# Patient Record
Sex: Female | Born: 1990
Health system: Southern US, Community
[De-identification: ages and names within clinical notes are randomized; demographics above are authoritative.]

## PROBLEM LIST (undated history)

## (undated) ENCOUNTER — Inpatient Hospital Stay: Payer: Self-pay

## (undated) DIAGNOSIS — R112 Nausea with vomiting, unspecified: Secondary | ICD-10-CM

## (undated) DIAGNOSIS — Z87442 Personal history of urinary calculi: Secondary | ICD-10-CM

## (undated) DIAGNOSIS — T8859XA Other complications of anesthesia, initial encounter: Secondary | ICD-10-CM

## (undated) DIAGNOSIS — T4145XA Adverse effect of unspecified anesthetic, initial encounter: Secondary | ICD-10-CM

## (undated) DIAGNOSIS — M1990 Unspecified osteoarthritis, unspecified site: Secondary | ICD-10-CM

## (undated) DIAGNOSIS — I10 Essential (primary) hypertension: Secondary | ICD-10-CM

## (undated) DIAGNOSIS — K429 Umbilical hernia without obstruction or gangrene: Secondary | ICD-10-CM

## (undated) DIAGNOSIS — Z9889 Other specified postprocedural states: Secondary | ICD-10-CM

## (undated) DIAGNOSIS — D649 Anemia, unspecified: Secondary | ICD-10-CM

## (undated) DIAGNOSIS — G610 Guillain-Barre syndrome: Secondary | ICD-10-CM

## (undated) HISTORY — PX: WISDOM TOOTH EXTRACTION: SHX21

## (undated) HISTORY — PX: HERNIA REPAIR: SHX51

## (undated) HISTORY — PX: NO PAST SURGERIES: SHX2092

---
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
## 2011-08-16 ENCOUNTER — Ambulatory Visit: Payer: Self-pay | Admitting: Physician Assistant

## 2011-08-25 DIAGNOSIS — G610 Guillain-Barre syndrome: Secondary | ICD-10-CM

## 2011-08-25 DIAGNOSIS — Z8669 Personal history of other diseases of the nervous system and sense organs: Secondary | ICD-10-CM

## 2011-08-25 HISTORY — DX: Guillain-Barre syndrome: G61.0

## 2011-08-25 HISTORY — DX: Personal history of other diseases of the nervous system and sense organs: Z86.69

## 2015-05-22 ENCOUNTER — Other Ambulatory Visit: Payer: Self-pay | Admitting: Obstetrics and Gynecology

## 2015-05-22 DIAGNOSIS — Z369 Encounter for antenatal screening, unspecified: Secondary | ICD-10-CM

## 2015-05-23 LAB — OB RESULTS CONSOLE ABO/RH: RH Type: POSITIVE

## 2015-05-23 LAB — OB RESULTS CONSOLE ANTIBODY SCREEN: ANTIBODY SCREEN: NEGATIVE

## 2015-05-23 LAB — OB RESULTS CONSOLE HEPATITIS B SURFACE ANTIGEN: Hepatitis B Surface Ag: NEGATIVE

## 2015-05-23 LAB — OB RESULTS CONSOLE GC/CHLAMYDIA
CHLAMYDIA, DNA PROBE: NEGATIVE
GC PROBE AMP, GENITAL: NEGATIVE

## 2015-05-23 LAB — OB RESULTS CONSOLE VARICELLA ZOSTER ANTIBODY, IGG: VARICELLA IGG: IMMUNE

## 2015-05-23 LAB — OB RESULTS CONSOLE HIV ANTIBODY (ROUTINE TESTING): HIV: NONREACTIVE

## 2015-05-23 LAB — OB RESULTS CONSOLE RPR: RPR: NONREACTIVE

## 2015-05-23 LAB — OB RESULTS CONSOLE RUBELLA ANTIBODY, IGM: Rubella: IMMUNE

## 2015-06-03 ENCOUNTER — Ambulatory Visit (HOSPITAL_BASED_OUTPATIENT_CLINIC_OR_DEPARTMENT_OTHER)
Admission: RE | Admit: 2015-06-03 | Discharge: 2015-06-03 | Disposition: A | Discharge status: Home / Self Care | Payer: Medicaid Other | Source: Ambulatory Visit | Attending: Maternal & Fetal Medicine | Admitting: Maternal & Fetal Medicine

## 2015-06-03 ENCOUNTER — Ambulatory Visit
Admission: RE | Admit: 2015-06-03 | Discharge: 2015-06-03 | Disposition: A | Discharge status: Home / Self Care | Payer: Medicaid Other | Source: Ambulatory Visit | Attending: Obstetrics and Gynecology | Admitting: Obstetrics and Gynecology

## 2015-06-03 DIAGNOSIS — Z36 Encounter for antenatal screening of mother: Secondary | ICD-10-CM | POA: Insufficient documentation

## 2015-06-03 DIAGNOSIS — Z369 Encounter for antenatal screening, unspecified: Secondary | ICD-10-CM

## 2015-06-03 DIAGNOSIS — Z8279 Family history of other congenital malformations, deformations and chromosomal abnormalities: Secondary | ICD-10-CM

## 2015-06-03 HISTORY — DX: Guillain-Barre syndrome: G61.0

## 2015-06-03 NOTE — Progress Notes (Addendum)
Referring physician:  [facility] Length of Consultation: 40 minute consultation   Holly Estrada  was referred to [facility] for genetic counseling to review prenatal screening and testing options.  This note summarizes the information we discussed. The patient was counseled by Holly Estrada, genetic counseling intern, directly supervised by Holly Stack, MS, CGC.   The following screening tests were offered: First trimester screening, which includes nuchal translucency ultrasound screen and first trimester maternal serum marker screening.  The nuchal translucency has approximately an 80% detection rate for Down syndrome and can be positive for other chromosome abnormalities as well as congenital heart defects.  When combined with a maternal serum marker screening, the detection rate is up to 90% for Down syndrome and up to 97% for trisomy 18.     Maternal serum marker screening, a blood test that measures pregnancy proteins, can provide risk assessments for Down syndrome, trisomy 18, and open neural tube defects (spina bifida, anencephaly). Because it does not directly examine the fetus, it cannot positively diagnose or rule out these problems.  Targeted ultrasound uses high frequency sound waves to create an image of the developing fetus.  An ultrasound is often recommended as a routine means of evaluating the pregnancy.  It is also used to screen for fetal anatomy problems (for example, a heart defect) that might be suggestive of a chromosomal or other abnormality.   Should these screening tests indicate an increased concern, then the following additional testing options would be offered:  The chorionic villus sampling procedure is available for first trimester chromosome analysis.  This involves the withdrawal of a small amount of chorionic villi (tissue from the developing placenta).  Risk of pregnancy loss is estimated to be approximately 1 in 200 to 1 in 100 (0.5  to 1%).  There is approximately a 1% (1 in 100) chance that the CVS chromosome results will be unclear.  Chorionic villi cannot be tested for neural tube defects.     Amniocentesis involves the removal of a small amount of amniotic fluid from the sac surrounding the fetus with the use of a thin needle inserted through the maternal abdomen and uterus.  Ultrasound guidance is used throughout the procedure.  Fetal cells from amniotic fluid are directly evaluated and > 99.5% of chromosome problems and > 98% of open neural tube defects can be detected. This procedure is generally performed after the 15th week of pregnancy.  The main risks to this procedure include complications leading to miscarriage in less than 1 in 200 cases (0.5%).  As another option for information if the pregnancy is suspected to be an an increased chance for certain chromosome conditions, we also reviewed the availability of cell free fetal DNA testing from maternal blood to determine whether or not the baby may have either Down syndrome, trisomy 27, or trisomy 18.  This test utilizes a maternal blood sample and DNA sequencing technology to isolate circulating cell free fetal DNA from maternal plasma.  The fetal DNA can then be analyzed for DNA sequences that are derived from the three most common chromosomes involved in aneuploidy, chromosomes 13, 18, and 21.  If the overall amount of DNA is greater than the expected level for any of these chromosomes, aneuploidy is suspected.  While we do not consider it a replacement for invasive testing and karyotype analysis, a negative result from this testing would be reassuring, though not a guarantee of a normal chromosome complement for the baby.  An abnormal result is certainly suggestive of an abnormal chromosome complement, though we would still recommend CVS or amniocentesis to confirm any findings from this testing.  We obtained a detailed family history and pregnancy history. This is Ms.  Estrada's first pregnancy. Holly Estrada, the father of the baby, has a seven-year-old daughter who was born with a heart defect. Holly Estrada mother has a history of two miscarriages, one being a twin pregnancy, as a result of a weak cervix, which was later corrected through surgery. The remainder of the family history is unremarkable for birth defects, developmental delays, chromosomal abnormalities, multiple miscarriages and consanguinity.   We discussed with Holly Estrada, the history of congenital heart defect on Holly Estrada's side. Heart defects can be caused by a number of things, both genetic and otherwise. When we see heart defects with no other associated intellectual or physical differences, we consider these multifactorial. This means a combination of genetic and environmental factors play a part in development of the defect. In these cases, we know family members are at a higher risk for congenital heart defects. Half siblings, or babies who share one parent with a child born with a congenital heart defect, have a 1-2% risk of having a similar defect. On the other hand, if this heart defect were found to be part of a syndrome, recurrence risk in a sibling could be as high as 50%. Syndromes can usually be identified if the heart defect is accompanied by other intellectual or physical differences. We can not completely rule this out without obtaining records on Holly Estrada's daughter, which we are happy to review. Given this history, we recommend a detailed anatomy ultrasound at 18 weeks and a fetal echocardiogram at [redacted] weeks gestation to further assess for structural heart defects.   We also discussed with Holly Estrada the option of sickle cell carrier testing. In the African American population, approximately 1 in 10 people are carriers of the sickle cell trait. When a person is a carrier of sickle cell trait, they have a change in one of their hemoglobin genes. This gene helps to form red blood cells that carry oxygen  throughout the body. Carriers are not expected to have any symptoms, but have a 50% chance of passing on the hemoglobin gene with the change to their children. If two people who are carriers of the sickle cell trait have children together, each pregnancy will have a 25% chance of being affected by sickle cell disease, a 50% chance of carrying the sickle cell trait, and a 25% chance of not carrying a change in either hemoglobin gene.  Holly Estrada reported that he is not a carrier of sickle cell trait. If this is the case, we know that he cannot pass on a change, and the risk of sickle cell disease in this pregnancy is very low. Without viewing his records, we cannot be sure of his carrier status. If Holly Estrada is interested in being screened in the future, she is encouraged to speak with [place] or her OB. She was also informed that all babies are tested at birth on the newborn screen both for sickle cell trait and sickle cell disease.   Holly Estrada reported no complications or exposures in the pregnancy that would be expected to increase the risk for birth defects.  After consideration of the options, Holly Estrada elected to proceed with first trimester screening and to schedule a fetal echocardiogram.  An ultrasound was performed at the time of the visit.  The gestational age  was consistent with  12 weeks.  Fetal anatomy could not be assessed due to early gestational age.  Please refer to the ultrasound report for details of that study.  Holly Estrada was encouraged to call with questions or concerns.  We can be contacted at [phone].  Holly Anderson, MS, CGC   I was immediately available and supervising. Holly Ponder, MD [facility]

## 2015-06-06 ENCOUNTER — Telehealth: Payer: Self-pay | Admitting: Obstetrics and Gynecology

## 2015-06-06 NOTE — Telephone Encounter (Signed)
Ms. Holly Estrada  elected to undergo First Trimester screening on June 03, 2015.  To review, first trimester screening, includes nuchal translucency ultrasound screen and/or first trimester maternal serum marker screening.  The nuchal translucency has approximately an 80% detection rate for Down syndrome and can be positive for other chromosome abnormalities as well as heart defects.  When combined with a maternal serum marker screening, the detection rate is up to 90% for Down syndrome and up to 97% for trisomy 13 and 18.     The results of the First Trimester Nuchal Translucency and Biochemical Screening were within normal range.  The risk for Down syndrome is now estimated to be 1 in 1,795.  The risk for Trisomy 13/18 is less than 1 in 10,000  Should more definitive information be desired, we would offer amniocentesis.  Because we do not yet know the effectiveness of combined first and second trimester screening, we do not recommend a maternal serum screen to assess the chance for chromosome conditions.  However, if screening for neural tube defects is desired, maternal serum screening for AFP only can be performed between 15 and [redacted] weeks gestation.

## 2015-08-05 ENCOUNTER — Ambulatory Visit
Admission: RE | Admit: 2015-08-05 | Discharge: 2015-08-05 | Disposition: A | Discharge status: Home / Self Care | Payer: Medicaid Other | Source: Ambulatory Visit | Attending: Obstetrics and Gynecology | Admitting: Obstetrics and Gynecology

## 2015-08-05 ENCOUNTER — Other Ambulatory Visit: Payer: Self-pay | Admitting: Obstetrics and Gynecology

## 2015-08-05 DIAGNOSIS — O26893 Other specified pregnancy related conditions, third trimester: Secondary | ICD-10-CM | POA: Insufficient documentation

## 2015-08-05 DIAGNOSIS — R109 Unspecified abdominal pain: Secondary | ICD-10-CM | POA: Diagnosis present

## 2015-08-05 DIAGNOSIS — N1330 Unspecified hydronephrosis: Secondary | ICD-10-CM | POA: Diagnosis not present

## 2015-08-25 DIAGNOSIS — Z8759 Personal history of other complications of pregnancy, childbirth and the puerperium: Secondary | ICD-10-CM

## 2015-08-25 HISTORY — DX: Personal history of other complications of pregnancy, childbirth and the puerperium: Z87.59

## 2015-11-10 ENCOUNTER — Observation Stay
Admission: EM | Admit: 2015-11-10 | Discharge: 2015-11-10 | Disposition: A | Discharge status: Home / Self Care | Payer: Medicaid Other | Attending: Obstetrics and Gynecology | Admitting: Obstetrics and Gynecology

## 2015-11-10 DIAGNOSIS — O4290 Premature rupture of membranes, unspecified as to length of time between rupture and onset of labor, unspecified weeks of gestation: Principal | ICD-10-CM | POA: Insufficient documentation

## 2015-11-10 HISTORY — DX: Anemia, unspecified: D64.9

## 2015-12-13 ENCOUNTER — Inpatient Hospital Stay
Admission: EM | Admit: 2015-12-13 | Discharge: 2015-12-18 | DRG: 765 | Disposition: A | Discharge status: Home / Self Care | Payer: Medicaid Other | Attending: Obstetrics and Gynecology | Admitting: Obstetrics and Gynecology

## 2015-12-13 DIAGNOSIS — G610 Guillain-Barre syndrome: Secondary | ICD-10-CM | POA: Diagnosis present

## 2015-12-13 DIAGNOSIS — Z3A40 40 weeks gestation of pregnancy: Secondary | ICD-10-CM | POA: Diagnosis not present

## 2015-12-13 DIAGNOSIS — O9852 Other viral diseases complicating childbirth: Secondary | ICD-10-CM | POA: Diagnosis present

## 2015-12-13 DIAGNOSIS — Z79899 Other long term (current) drug therapy: Secondary | ICD-10-CM | POA: Diagnosis not present

## 2015-12-13 DIAGNOSIS — D62 Acute posthemorrhagic anemia: Secondary | ICD-10-CM | POA: Diagnosis not present

## 2015-12-13 DIAGNOSIS — Z841 Family history of disorders of kidney and ureter: Secondary | ICD-10-CM

## 2015-12-13 DIAGNOSIS — Z23 Encounter for immunization: Secondary | ICD-10-CM | POA: Diagnosis not present

## 2015-12-13 DIAGNOSIS — Z8279 Family history of other congenital malformations, deformations and chromosomal abnormalities: Secondary | ICD-10-CM

## 2015-12-13 DIAGNOSIS — O9902 Anemia complicating childbirth: Secondary | ICD-10-CM | POA: Diagnosis present

## 2015-12-13 DIAGNOSIS — Z9889 Other specified postprocedural states: Secondary | ICD-10-CM

## 2015-12-13 LAB — OB RESULTS CONSOLE GBS: GBS: NEGATIVE

## 2015-12-13 LAB — CBC
HEMATOCRIT: 34.2 % — AB (ref 35.0–47.0)
HEMOGLOBIN: 11.7 g/dL — AB (ref 12.0–16.0)
MCH: 32 pg (ref 26.0–34.0)
MCHC: 34.3 g/dL (ref 32.0–36.0)
MCV: 93.3 fL (ref 80.0–100.0)
Platelets: 160 10*3/uL (ref 150–440)
RBC: 3.67 MIL/uL — ABNORMAL LOW (ref 3.80–5.20)
RDW: 12.8 % (ref 11.5–14.5)
WBC: 11.7 10*3/uL — ABNORMAL HIGH (ref 3.6–11.0)

## 2015-12-13 LAB — TYPE AND SCREEN
ABO/RH(D): A POS
ANTIBODY SCREEN: NEGATIVE

## 2015-12-13 MED ORDER — LIDOCAINE HCL (PF) 1 % IJ SOLN
30.0000 mL | INTRAMUSCULAR | Status: DC | PRN
  Filled 2015-12-13: qty 30

## 2015-12-13 MED ORDER — OXYCODONE-ACETAMINOPHEN 5-325 MG PO TABS: 1.0000 | ORAL_TABLET | ORAL | Status: DC | PRN

## 2015-12-13 MED ORDER — PRENATAL MULTIVITAMIN CH
1.0000 | ORAL_TABLET | Freq: Every day | ORAL | Status: DC
Start: 2015-12-13 — End: 2015-12-13

## 2015-12-13 MED ORDER — OXYTOCIN BOLUS FROM INFUSION: 500.0000 mL | INTRAVENOUS | Status: DC

## 2015-12-13 MED ORDER — LEVOTHYROXINE SODIUM 150 MCG PO TABS: 150.0000 ug | ORAL_TABLET | Freq: Every day | ORAL | Status: DC

## 2015-12-13 MED ORDER — ONDANSETRON HCL 4 MG/2ML IJ SOLN: 4.0000 mg | Freq: Four times a day (QID) | INTRAMUSCULAR | Status: DC | PRN

## 2015-12-13 MED ORDER — BUTORPHANOL TARTRATE 1 MG/ML IJ SOLN: 1.0000 mg | INTRAMUSCULAR | Status: DC | PRN

## 2015-12-13 MED ORDER — LACTATED RINGERS IV SOLN: 500.0000 mL | INTRAVENOUS | Status: DC | PRN

## 2015-12-13 MED ORDER — TERBUTALINE SULFATE 1 MG/ML IJ SOLN: 0.2500 mg | Freq: Once | INTRAMUSCULAR | Status: DC | PRN

## 2015-12-13 MED ORDER — OXYTOCIN 40 UNITS IN LACTATED RINGERS INFUSION - SIMPLE MED
2.5000 [IU]/h | INTRAVENOUS | Status: DC
  Filled 2015-12-13: qty 1000

## 2015-12-13 MED ORDER — LACTATED RINGERS IV SOLN
INTRAVENOUS | Status: DC
  Administered 2015-12-13: 21:00:00 via INTRAVENOUS
  Administered 2015-12-14 (×2): 125 mL/h via INTRAVENOUS
  Administered 2015-12-14 – 2015-12-15 (×2): via INTRAVENOUS

## 2015-12-13 MED ORDER — OXYTOCIN 10 UNIT/ML IJ SOLN
INTRAMUSCULAR | Status: AC
  Filled 2015-12-13: qty 2

## 2015-12-13 MED ORDER — DINOPROSTONE 10 MG VA INST
10.0000 mg | VAGINAL_INSERT | Freq: Once | VAGINAL | Status: AC
  Administered 2015-12-13: 10 mg via VAGINAL
  Filled 2015-12-13: qty 1

## 2015-12-13 MED ORDER — OXYCODONE-ACETAMINOPHEN 5-325 MG PO TABS: 2.0000 | ORAL_TABLET | ORAL | Status: DC | PRN

## 2015-12-13 MED ORDER — ACETAMINOPHEN 325 MG PO TABS: 650.0000 mg | ORAL_TABLET | ORAL | Status: DC | PRN

## 2015-12-13 MED ORDER — AMMONIA AROMATIC IN INHA
RESPIRATORY_TRACT | Status: AC
  Filled 2015-12-13: qty 10

## 2015-12-13 MED ORDER — CITRIC ACID-SODIUM CITRATE 334-500 MG/5ML PO SOLN
30.0000 mL | ORAL | Status: DC | PRN
  Administered 2015-12-15: 30 mL via ORAL
  Filled 2015-12-13: qty 30

## 2015-12-13 MED ORDER — SODIUM CHLORIDE FLUSH 0.9 % IV SOLN
INTRAVENOUS | Status: AC
  Filled 2015-12-13: qty 10

## 2015-12-13 MED ORDER — ZOLPIDEM TARTRATE 5 MG PO TABS
5.0000 mg | ORAL_TABLET | Freq: Every evening | ORAL | Status: DC | PRN
  Administered 2015-12-13: 5 mg via ORAL
  Filled 2015-12-13: qty 1

## 2015-12-13 MED ORDER — MISOPROSTOL 200 MCG PO TABS
ORAL_TABLET | ORAL | Status: AC
  Filled 2015-12-13: qty 4

## 2015-12-13 NOTE — H&P (Signed)
  OB ADMISSION/ HISTORY & PHYSICAL:  Admission Date: 12/13/2015  8:26 PM  Admit Diagnosis: elective postdates induction of labor at 40+2 weeks  Holly Estrada is a 25 y.o. female presenting for elective postdates induction of labor at 40+2 weeks   Prenatal History: G1P0000   EDC : 12/11/2015, by Ultrasound  [facility]  Prenatal course complicated by Hx. Of Guillain-Barre Syndrome - flu vaccine contraindicated   Prenatal Labs: ABO, Rh: --/--/PENDING (04/21 2113) Antibody: PENDING (04/21 2113) Rubella: Immune (09/29 0000)  RPR: Nonreactive (09/29 0000)  HBsAg: Negative (09/29 0000)  HIV: Non-reactive (09/29 0000)  GTT: 104 GBS:   Negative  Varicella: Immune  Medical / Surgical History :  Past medical history:  Past Medical History  Diagnosis Date  . Guillain Barr syndrome (HCC) 2013  . Anemia      Past surgical history:  Past Surgical History  Procedure Laterality Date  . No past surgeries      Family History:  Family History  Problem Relation Age of Onset  . Kidney failure Maternal Grandfather      Social History:  reports that she has never smoked. She has never used smokeless tobacco. She reports that she does not drink alcohol or use illicit drugs.   Allergies: Review of patient's allergies indicates no known allergies.    Current Medications at time of admission:  Prior to Admission medications   Medication Sig Start Date End Date Taking? Authorizing Provider  Ascorbic Acid (VITAMIN C) 100 MG tablet Take 100 mg by mouth daily.    Historical Provider, MD  ferrous sulfate 325 (65 FE) MG tablet Take 325 mg by mouth daily with breakfast.    Historical Provider, MD  Prenatal Vit-Fe Fumarate-FA (PRENATAL MULTIVITAMIN) TABS tablet Take 1 tablet by mouth daily at 12 noon.    Historical Provider, MD     Review of Systems: Active FM Ctx currently every minutes No LOF  / SROM  NO VB, bloody show   Physical Exam:  VS: Blood pressure  135/81, pulse 61, height 5\' 5"  (1.651 m), weight 83.915 kg (185 lb).  General: alert and oriented, appears calm Heart: RRR Lungs: Clear lung fields Abdomen: Gravid, soft and non-tender, non-distended / uterus: gravid, non-tender Extremities: no edema  Genitalia / VE: Dilation: 1 Exam by:: Colletta MarylandN. Sullivan, RN  FHR: baseline rate 140 bpm/ variability Moderate / accelerations + / no decelerations TOCO: every 2-7 minutes  Assessment: 40+[redacted] weeks gestation IOL stage of labor for postdates FHR category 1   Plan:  1. Admit to [facility]   - Routine labor and delivery orders   - May have Stadol 1mg  IVP every 1 hour PRN for pain   - Cervidil 10mg  vaginally x 1 for induction  2. GBS Negative   - No prophylaxis indication  3. Anticipate NSVD  - EFW on 4/4: 6#12oz  Dr. Feliberto GottronSchermerhorn notified of admission / plan of care  Carlean JewsMeredith Lior Hoen, CNM

## 2015-12-14 ENCOUNTER — Inpatient Hospital Stay: Payer: Medicaid Other | Admitting: Anesthesiology

## 2015-12-14 LAB — COMPREHENSIVE METABOLIC PANEL
ALT: 15 U/L (ref 14–54)
ANION GAP: 9 (ref 5–15)
AST: 26 U/L (ref 15–41)
Albumin: 3 g/dL — ABNORMAL LOW (ref 3.5–5.0)
Alkaline Phosphatase: 141 U/L — ABNORMAL HIGH (ref 38–126)
BILIRUBIN TOTAL: 0.4 mg/dL (ref 0.3–1.2)
BUN: <5 mg/dL — ABNORMAL LOW (ref 6–20)
CO2: 19 mmol/L — ABNORMAL LOW (ref 22–32)
Calcium: 8.9 mg/dL (ref 8.9–10.3)
Chloride: 109 mmol/L (ref 101–111)
Creatinine, Ser: 0.57 mg/dL (ref 0.44–1.00)
Glucose, Bld: 79 mg/dL (ref 65–99)
POTASSIUM: 3.7 mmol/L (ref 3.5–5.1)
Sodium: 137 mmol/L (ref 135–145)
TOTAL PROTEIN: 6.2 g/dL — AB (ref 6.5–8.1)

## 2015-12-14 LAB — CBC
HCT: 35.3 % (ref 35.0–47.0)
HEMOGLOBIN: 12.3 g/dL (ref 12.0–16.0)
MCH: 32.7 pg (ref 26.0–34.0)
MCHC: 34.8 g/dL (ref 32.0–36.0)
MCV: 93.8 fL (ref 80.0–100.0)
Platelets: 140 10*3/uL — ABNORMAL LOW (ref 150–440)
RBC: 3.76 MIL/uL — AB (ref 3.80–5.20)
RDW: 12.7 % (ref 11.5–14.5)
WBC: 13.1 10*3/uL — AB (ref 3.6–11.0)

## 2015-12-14 LAB — PROTEIN / CREATININE RATIO, URINE
CREATININE, URINE: 37 mg/dL
PROTEIN CREATININE RATIO: 1.08 mg/mg{creat} — AB (ref 0.00–0.15)
TOTAL PROTEIN, URINE: 40 mg/dL

## 2015-12-14 LAB — ABO/RH: ABO/RH(D): A POS

## 2015-12-14 LAB — PLATELET COUNT: Platelets: 138 10*3/uL — ABNORMAL LOW (ref 150–440)

## 2015-12-14 LAB — URIC ACID: URIC ACID, SERUM: 5.3 mg/dL (ref 2.3–6.6)

## 2015-12-14 MED ORDER — NALBUPHINE HCL 10 MG/ML IJ SOLN
5.0000 mg | INTRAMUSCULAR | Status: DC | PRN
Start: 2015-12-14 — End: 2015-12-15

## 2015-12-14 MED ORDER — BUPIVACAINE HCL (PF) 0.25 % IJ SOLN
INTRAMUSCULAR | Status: DC | PRN
  Administered 2015-12-14: 5 mL via EPIDURAL

## 2015-12-14 MED ORDER — DIPHENHYDRAMINE HCL 50 MG/ML IJ SOLN: 12.5000 mg | INTRAMUSCULAR | Status: DC | PRN

## 2015-12-14 MED ORDER — NALBUPHINE HCL 10 MG/ML IJ SOLN: 5.0000 mg | Freq: Once | INTRAMUSCULAR | Status: DC | PRN

## 2015-12-14 MED ORDER — SODIUM CHLORIDE 0.9% FLUSH: 3.0000 mL | INTRAVENOUS | Status: DC | PRN

## 2015-12-14 MED ORDER — LIDOCAINE-EPINEPHRINE (PF) 1.5 %-1:200000 IJ SOLN
INTRAMUSCULAR | Status: DC | PRN
  Administered 2015-12-14: 3 mL via EPIDURAL

## 2015-12-14 MED ORDER — FENTANYL 2.5 MCG/ML W/ROPIVACAINE 0.2% IN NS 100 ML EPIDURAL INFUSION (ARMC-ANES)
10.0000 mL/h | EPIDURAL | Status: DC
Start: 2015-12-14 — End: 2015-12-15
  Administered 2015-12-14 – 2015-12-15 (×2): 10 mL/h via EPIDURAL
  Filled 2015-12-14: qty 100

## 2015-12-14 MED ORDER — NALBUPHINE HCL 10 MG/ML IJ SOLN: 5.0000 mg | INTRAMUSCULAR | Status: DC | PRN

## 2015-12-14 MED ORDER — TERBUTALINE SULFATE 1 MG/ML IJ SOLN: 0.2500 mg | Freq: Once | INTRAMUSCULAR | Status: DC | PRN

## 2015-12-14 MED ORDER — NALOXONE HCL 0.4 MG/ML IJ SOLN: 0.4000 mg | INTRAMUSCULAR | Status: DC | PRN

## 2015-12-14 MED ORDER — NALOXONE HCL 2 MG/2ML IJ SOSY
1.0000 ug/kg/h | PREFILLED_SYRINGE | INTRAVENOUS | Status: DC | PRN
  Filled 2015-12-14: qty 2

## 2015-12-14 MED ORDER — LIDOCAINE HCL (PF) 1 % IJ SOLN
INTRAMUSCULAR | Status: DC | PRN
  Administered 2015-12-14: 1 mL via INTRADERMAL

## 2015-12-14 MED ORDER — ONDANSETRON HCL 4 MG/2ML IJ SOLN: 4.0000 mg | Freq: Three times a day (TID) | INTRAMUSCULAR | Status: DC | PRN

## 2015-12-14 MED ORDER — FENTANYL 2.5 MCG/ML W/ROPIVACAINE 0.2% IN NS 100 ML EPIDURAL INFUSION (ARMC-ANES)
EPIDURAL | Status: AC
  Administered 2015-12-15: 10 mL/h via EPIDURAL
  Filled 2015-12-14: qty 100

## 2015-12-14 MED ORDER — DIPHENHYDRAMINE HCL 25 MG PO CAPS
25.0000 mg | ORAL_CAPSULE | ORAL | Status: DC | PRN
Start: 2015-12-14 — End: 2015-12-15

## 2015-12-14 MED ORDER — OXYTOCIN 40 UNITS IN LACTATED RINGERS INFUSION - SIMPLE MED
1.0000 m[IU]/min | INTRAVENOUS | Status: DC
  Administered 2015-12-14: 1 m[IU]/min via INTRAVENOUS
  Administered 2015-12-15: 1000 mL via INTRAVENOUS
  Administered 2015-12-15: 10 m[IU]/min via INTRAVENOUS
  Filled 2015-12-14: qty 1000

## 2015-12-14 NOTE — Plan of Care (Signed)
Problem: Education: Goal: Knowledge of Childbirth will improve Outcome: Progressing Scheduled IOL, Cervidil placed following cervical exam at 2150.c 1cm dilated. FHT reactive. GBS neg

## 2015-12-14 NOTE — Anesthesia Preprocedure Evaluation (Signed)
Anesthesia Evaluation  Patient identified by MRN, date of birth, ID band Patient awake    Reviewed: Allergy & Precautions, H&P , NPO status , Patient's Chart, lab work & pertinent test results  Airway Mallampati: III  TM Distance: >3 FB Neck ROM: full    Dental  (+) Poor Dentition   Pulmonary neg pulmonary ROS, neg shortness of breath,    Pulmonary exam normal breath sounds clear to auscultation       Cardiovascular Exercise Tolerance: Good hypertension, Normal cardiovascular exam Rhythm:regular Rate:Normal     Neuro/Psych  Neuromuscular disease    GI/Hepatic negative GI ROS,   Endo/Other    Renal/GU   negative genitourinary   Musculoskeletal   Abdominal   Peds  Hematology negative hematology ROS (+)   Anesthesia Other Findings Past Medical History:   Guillain Barr syndrome (HCC)                   2013         Anemia                                                      Past Surgical History:   NO PAST SURGERIES                                            BMI    Body Mass Index   30.78 kg/m 2    Patient reports baseline numbness in right hip  Reproductive/Obstetrics (+) Pregnancy                             Anesthesia Physical Anesthesia Plan  ASA: III  Anesthesia Plan: Epidural   Post-op Pain Management:    Induction:   Airway Management Planned:   Additional Equipment:   Intra-op Plan:   Post-operative Plan:   Informed Consent: I have reviewed the patients History and Physical, chart, labs and discussed the procedure including the risks, benefits and alternatives for the proposed anesthesia with the patient or authorized representative who has indicated his/her understanding and acceptance.     Plan Discussed with: Anesthesiologist  Anesthesia Plan Comments: (Consented for increased risk due to previous neurologic disease.  Patient voiced understanding.)         Anesthesia Quick Evaluation

## 2015-12-14 NOTE — Progress Notes (Signed)
S:  Doing okay, was able to sleep through the night       Cervidil removed at 0950   O:  VS: Blood pressure 113/71, pulse 62, temperature 98.6 F (37 C), temperature source Oral, resp. rate 16, height 5\' 5"  (1.651 m), weight 83.915 kg (185 lb).        FHR : baseline 140 bpm  / variability Moderate / accelerations + / no decelerations        Toco: contractions every 2-6 minutes / Moderate        Cervix : Dilation: 2 Effacement (%): 60 Cervical Position: Anterior Station: -2 Presentation: Vertex Exam by:: M. Darianne Muralles CNM        Membranes: Intact  A: Induction of labor     FHR category 1  P: May eat breakfast and shower      Plan to start Pitocin after - begin at 1 milliunit and increase by 2 milliunits      Anticipate NSVD  Holly Estrada, CNM

## 2015-12-14 NOTE — Progress Notes (Signed)
S:  Doing well - ambulating PRN      Pitocin started at 1227  O:  VS: Blood pressure 123/82, pulse 89, temperature 98.6 F (37 C), temperature source Axillary, resp. rate 20, height 5\' 5"  (1.651 m), weight 83.915 kg (185 lb).        FHR : baseline: 140 bpm / variability Moderate / accelerations + / no decelerations        Toco: contractions every 1-4 minutes / mild        Cervix : deferred        Membranes: intact  A: Latent labor     FHR category 1  P: Continue Pitocin augmentation       Anticipate NSVD   Carlean JewsMeredith Sigmon, CNM

## 2015-12-14 NOTE — Progress Notes (Signed)
S:  Resting comfortably with epidural       O:  VS: Blood pressure 133/87, pulse 52, temperature 98.6 F (37 C), temperature source Oral, resp. rate 16, height 5\' 5"  (1.651 m), weight 83.915 kg (185 lb).        FHR : baseline 130 bpm / variability Moderate / accelerations + / no decelerations        Toco: contractions every 1.5-3.5 minutes / Moderate / MVU 210        Cervix : Dilation: 5.5 Effacement (%): 80 Cervical Position: Middle Station: 0 Presentation: Vertex Exam by:: MS, CNM        Membranes: AROM - light meconium  A: Latent labor     FHR category 1  P: Continue expectant management       Anticipate NSVD  Carlean JewsMeredith Sigmon, CNM

## 2015-12-14 NOTE — Progress Notes (Signed)
Holly Estrada CNM aware of increased blood pressure readings.  Will continue to monitor.

## 2015-12-14 NOTE — Progress Notes (Addendum)
S:  Comfortable with her epidural        Pitocin on 13 milliunits       Elevated BPs prior to epidural - pre-eclampsia labs drawn   O:  VS: Blood pressure 123/70, pulse 63, temperature 98.6 F (37 C), temperature source Oral, resp. rate 18, height 5\' 5"  (1.651 m), weight 83.915 kg (185 lb).        FHR : baseline 135 bpm / variability Moderate / accelerations + / no decelerations        Toco: contractions every 2-4.5 minutes / mild to moderate        Cervix : Dilation: 4.5 Effacement (%): 70 Cervical Position: Middle Station: 0 Presentation: Vertex Exam by:: MS, CNM        Membranes: AROM - clear to light meconium fluid   A: Latent labor     FHR category 1   P: BP's improving with epidural      IUPC placed without difficulty      Continue Pitocin augmentation to adequate MVUs     Anticipate NSVD     Reassess in 2 hours  Dr. Feliberto GottronSchermerhorn updated with plan of care  Carlean JewsMeredith Braniya Farrugia, CNM

## 2015-12-14 NOTE — Progress Notes (Addendum)
Called by RN for fetal prolonged deceleration lasting 3 minutes Pitocin was on 21 milliunits  Cervical exam by RN still 5cm/80%/0  Fetal monitoring: Baseline: 130 bpm / Moderate variability/ +accels/ Prolonged decel to nadir of 60 bpm with return to baseline   Assessment:  Latent Labor Category 2 Tracing   Plan:  Turn pitocin off  Good return to baseline  Continue close monitoring of fetal well-being  Reassess in 1 hour, If FHR stable, restart Pitocin at 6 milliunits   Dr. Feliberto GottronSchermerhorn updated   Carlean JewsMeredith Emmalie Haigh, CNM

## 2015-12-14 NOTE — Anesthesia Procedure Notes (Addendum)
Epidural Patient location during procedure: OB Start time: 12/14/2015 6:28 PM End time: 12/14/2015 6:30 PM  Staffing Anesthesiologist: Margorie JohnPISCITELLO, JOSEPH K Performed by: anesthesiologist   Preanesthetic Checklist Completed: patient identified, site marked, surgical consent, pre-op evaluation, timeout performed, IV checked, risks and benefits discussed and monitors and equipment checked  Epidural Patient position: sitting Prep: Betadine Patient monitoring: heart rate, continuous pulse ox and blood pressure Approach: midline Location: L4-L5 Injection technique: LOR saline  Needle:  Needle type: Tuohy  Needle gauge: 17 G Needle length: 9 cm and 9 Needle insertion depth: 6 cm Catheter type: closed end flexible Catheter size: 19 Gauge Catheter at skin depth: 10 cm Test dose: negative and 1.5% lidocaine with Epi 1:200 K  Assessment Sensory level: T10 Events: blood not aspirated, injection not painful, no injection resistance, negative IV test and no paresthesia  Additional Notes   Patient tolerated the insertion well without immediate complications.Reason for block:procedure for pain  Procedure Name: Intubation Date/Time: 12/15/2015 8:25 AM Performed by: Irving BurtonBACHICH, Pepe Mineau Pre-anesthesia Checklist: Patient identified, Emergency Drugs available, Suction available and Patient being monitored Patient Re-evaluated:Patient Re-evaluated prior to inductionOxygen Delivery Method: Circle system utilized Preoxygenation: Pre-oxygenation with 100% oxygen Intubation Type: IV induction, Cricoid Pressure applied and Rapid sequence Laryngoscope Size: Mac and 3 Grade View: Grade I Tube type: Oral Tube size: 7.0 mm Number of attempts: 1 Airway Equipment and Method: Stylet Placement Confirmation: ETT inserted through vocal cords under direct vision,  positive ETCO2 and breath sounds checked- equal and bilateral Secured at: 22 cm Tube secured with: Tape Dental Injury: Teeth and Oropharynx as  per pre-operative assessment

## 2015-12-14 NOTE — Progress Notes (Signed)
S:  Tolerating contractions well       Discussed AROM with patient to progress labor - discussed risks and benefits - pt. Agrees  O:  VS: Blood pressure 139/93, pulse 77, temperature 98.4 F (36.9 C), temperature source Axillary, resp. rate 18, height 5\' 5"  (1.651 m), weight 83.915 kg (185 lb).        FHR : baseline 135 bpm / variability Moderate / accelerations + / no decelerations        Toco: contractions every 2-4 minutes / Mild-moderate         Cervix: Dilation: 3.5 Effacement (%): 70 Cervical Position: Middle Station: -1 Presentation: Vertex Exam by:: MS, CNM        Membranes: AROM - possible light meconium - will continue to monitor   A: Latent labor     FHR category 1  P: Continue Pitocin augmentation      Pt. May have epidural upon request      Anticipate NSVD  Carlean JewsMeredith Lavina Estrada, CNM

## 2015-12-15 ENCOUNTER — Encounter: Payer: Self-pay | Admitting: *Deleted

## 2015-12-15 ENCOUNTER — Encounter
Admission: EM | Disposition: A | Discharge status: Home / Self Care | Payer: Self-pay | Source: Home / Self Care | Attending: Obstetrics and Gynecology

## 2015-12-15 DIAGNOSIS — Z9889 Other specified postprocedural states: Secondary | ICD-10-CM

## 2015-12-15 LAB — RPR: RPR Ser Ql: NONREACTIVE

## 2015-12-15 SURGERY — Surgical Case
Anesthesia: Epidural

## 2015-12-15 MED ORDER — METHYLERGONOVINE MALEATE 0.2 MG/ML IJ SOLN
INTRAMUSCULAR | Status: AC
  Administered 2015-12-15: 0.2 mg via INTRAMUSCULAR
  Filled 2015-12-15: qty 1

## 2015-12-15 MED ORDER — DIPHENHYDRAMINE HCL 50 MG/ML IJ SOLN: 12.5000 mg | INTRAMUSCULAR | Status: DC | PRN

## 2015-12-15 MED ORDER — HYDROMORPHONE HCL 1 MG/ML IJ SOLN
INTRAMUSCULAR | Status: AC
  Administered 2015-12-15: 0.5 mg via INTRAVENOUS
  Filled 2015-12-15: qty 1

## 2015-12-15 MED ORDER — HYDROMORPHONE HCL 1 MG/ML IJ SOLN
INTRAMUSCULAR | Status: DC | PRN
Start: 2015-12-15 — End: 2015-12-15
  Administered 2015-12-15: 0.5 mg via SUBCUTANEOUS

## 2015-12-15 MED ORDER — PHENYLEPHRINE HCL 10 MG/ML IJ SOLN
INTRAMUSCULAR | Status: DC | PRN
  Administered 2015-12-15 (×2): 100 ug via INTRAVENOUS

## 2015-12-15 MED ORDER — NALOXONE HCL 0.4 MG/ML IJ SOLN: 0.4000 mg | INTRAMUSCULAR | Status: DC | PRN

## 2015-12-15 MED ORDER — SIMETHICONE 80 MG PO CHEW
80.0000 mg | CHEWABLE_TABLET | ORAL | Status: DC
  Filled 2015-12-15: qty 1

## 2015-12-15 MED ORDER — KETOROLAC TROMETHAMINE 30 MG/ML IJ SOLN
INTRAMUSCULAR | Status: AC
  Administered 2015-12-15: 30 mg via INTRAVENOUS
  Filled 2015-12-15: qty 1

## 2015-12-15 MED ORDER — DIBUCAINE 1 % RE OINT: 1.0000 "application " | TOPICAL_OINTMENT | RECTAL | Status: DC | PRN

## 2015-12-15 MED ORDER — KETOROLAC TROMETHAMINE 30 MG/ML IJ SOLN
30.0000 mg | Freq: Four times a day (QID) | INTRAMUSCULAR | Status: AC | PRN
  Administered 2015-12-15 – 2015-12-16 (×4): 30 mg via INTRAVENOUS
  Filled 2015-12-15 (×3): qty 1

## 2015-12-15 MED ORDER — LACTATED RINGERS IV SOLN: INTRAVENOUS | Status: DC

## 2015-12-15 MED ORDER — ACETAMINOPHEN 325 MG PO TABS: 650.0000 mg | ORAL_TABLET | ORAL | Status: DC | PRN

## 2015-12-15 MED ORDER — COCONUT OIL OIL
1.0000 "application " | TOPICAL_OIL | Status: DC | PRN
  Administered 2015-12-15: 1 via TOPICAL
  Filled 2015-12-15 (×2): qty 120

## 2015-12-15 MED ORDER — OXYCODONE-ACETAMINOPHEN 5-325 MG PO TABS
2.0000 | ORAL_TABLET | ORAL | Status: DC | PRN
  Administered 2015-12-15 – 2015-12-18 (×11): 2 via ORAL
  Filled 2015-12-15 (×12): qty 2

## 2015-12-15 MED ORDER — PROPOFOL 10 MG/ML IV BOLUS
INTRAVENOUS | Status: DC | PRN
  Administered 2015-12-15: 170 mg via INTRAVENOUS

## 2015-12-15 MED ORDER — ONDANSETRON HCL 4 MG/2ML IJ SOLN
INTRAMUSCULAR | Status: DC | PRN
  Administered 2015-12-15: 4 mg via INTRAVENOUS

## 2015-12-15 MED ORDER — SIMETHICONE 80 MG PO CHEW
80.0000 mg | CHEWABLE_TABLET | Freq: Three times a day (TID) | ORAL | Status: DC
  Administered 2015-12-16 (×3): 80 mg via ORAL
  Filled 2015-12-15 (×2): qty 1

## 2015-12-15 MED ORDER — NALBUPHINE HCL 10 MG/ML IJ SOLN: 5.0000 mg | Freq: Once | INTRAMUSCULAR | Status: DC | PRN

## 2015-12-15 MED ORDER — SUCCINYLCHOLINE CHLORIDE 20 MG/ML IJ SOLN
INTRAMUSCULAR | Status: DC | PRN
  Administered 2015-12-15: 100 mg via INTRAVENOUS

## 2015-12-15 MED ORDER — DIPHENHYDRAMINE HCL 25 MG PO CAPS
25.0000 mg | ORAL_CAPSULE | ORAL | Status: DC | PRN
  Administered 2015-12-18: 25 mg via ORAL
  Filled 2015-12-15: qty 1

## 2015-12-15 MED ORDER — IBUPROFEN 600 MG PO TABS
600.0000 mg | ORAL_TABLET | Freq: Four times a day (QID) | ORAL | Status: DC
  Administered 2015-12-16 – 2015-12-18 (×9): 600 mg via ORAL
  Filled 2015-12-15 (×9): qty 1

## 2015-12-15 MED ORDER — NALBUPHINE HCL 10 MG/ML IJ SOLN: 5.0000 mg | INTRAMUSCULAR | Status: DC | PRN

## 2015-12-15 MED ORDER — SIMETHICONE 80 MG PO CHEW: 80.0000 mg | CHEWABLE_TABLET | ORAL | Status: DC | PRN

## 2015-12-15 MED ORDER — CEFAZOLIN SODIUM-DEXTROSE 2-4 GM/100ML-% IV SOLN
2.0000 g | Freq: Three times a day (TID) | INTRAVENOUS | Status: DC
  Administered 2015-12-15: 2 g via INTRAVENOUS
  Filled 2015-12-15 (×4): qty 100

## 2015-12-15 MED ORDER — ZOLPIDEM TARTRATE 5 MG PO TABS: 5.0000 mg | ORAL_TABLET | Freq: Every evening | ORAL | Status: DC | PRN

## 2015-12-15 MED ORDER — LACTATED RINGERS IV SOLN
INTRAVENOUS | Status: DC
  Administered 2015-12-15 (×2): via INTRAVENOUS

## 2015-12-15 MED ORDER — ONDANSETRON HCL 4 MG/2ML IJ SOLN: 4.0000 mg | Freq: Three times a day (TID) | INTRAMUSCULAR | Status: DC | PRN

## 2015-12-15 MED ORDER — BUPIVACAINE 0.25 % ON-Q PUMP DUAL CATH 400 ML
INJECTION | Status: AC
  Filled 2015-12-15: qty 400

## 2015-12-15 MED ORDER — MEPERIDINE HCL 25 MG/ML IJ SOLN: 6.2500 mg | INTRAMUSCULAR | Status: DC | PRN

## 2015-12-15 MED ORDER — NALOXONE HCL 2 MG/2ML IJ SOSY
1.0000 ug/kg/h | PREFILLED_SYRINGE | INTRAVENOUS | Status: DC | PRN
  Filled 2015-12-15: qty 2

## 2015-12-15 MED ORDER — SENNOSIDES-DOCUSATE SODIUM 8.6-50 MG PO TABS
2.0000 | ORAL_TABLET | ORAL | Status: DC
  Administered 2015-12-16 – 2015-12-18 (×3): 2 via ORAL
  Filled 2015-12-15 (×3): qty 2

## 2015-12-15 MED ORDER — HYDROMORPHONE HCL 1 MG/ML IJ SOLN
0.2500 mg | INTRAMUSCULAR | Status: DC | PRN
  Administered 2015-12-15 (×2): 0.5 mg via INTRAVENOUS

## 2015-12-15 MED ORDER — OXYCODONE HCL 5 MG/5ML PO SOLN: 5.0000 mg | Freq: Once | ORAL | Status: DC | PRN

## 2015-12-15 MED ORDER — BUPIVACAINE HCL 0.5 % IJ SOLN
50.0000 mL | Freq: Once | INTRAMUSCULAR | Status: AC
  Administered 2015-12-15: 50 mL

## 2015-12-15 MED ORDER — CITRIC ACID-SODIUM CITRATE 334-500 MG/5ML PO SOLN: 30.0000 mL | ORAL | Status: DC

## 2015-12-15 MED ORDER — DIPHENHYDRAMINE HCL 25 MG PO CAPS: 25.0000 mg | ORAL_CAPSULE | Freq: Four times a day (QID) | ORAL | Status: DC | PRN

## 2015-12-15 MED ORDER — PRENATAL MULTIVITAMIN CH
1.0000 | ORAL_TABLET | Freq: Every day | ORAL | Status: DC
  Administered 2015-12-17 – 2015-12-18 (×2): 1 via ORAL
  Filled 2015-12-15 (×4): qty 1

## 2015-12-15 MED ORDER — WITCH HAZEL-GLYCERIN EX PADS: 1.0000 "application " | MEDICATED_PAD | CUTANEOUS | Status: DC | PRN

## 2015-12-15 MED ORDER — MENTHOL 3 MG MT LOZG
1.0000 | LOZENGE | OROMUCOSAL | Status: DC | PRN
Start: 2015-12-15 — End: 2015-12-18
  Filled 2015-12-15: qty 9

## 2015-12-15 MED ORDER — BUPIVACAINE HCL (PF) 0.5 % IJ SOLN
INTRAMUSCULAR | Status: AC
  Administered 2015-12-15: 50 mL
  Filled 2015-12-15: qty 30

## 2015-12-15 MED ORDER — CEFAZOLIN SODIUM-DEXTROSE 2-4 GM/100ML-% IV SOLN
INTRAVENOUS | Status: AC
  Administered 2015-12-15: 2 g via INTRAVENOUS
  Filled 2015-12-15: qty 100

## 2015-12-15 MED ORDER — LIDOCAINE HCL (PF) 2 % IJ SOLN
INTRAMUSCULAR | Status: DC | PRN
  Administered 2015-12-15: 100 mg via INTRADERMAL
  Administered 2015-12-15: 60 mg via INTRADERMAL
  Administered 2015-12-15 (×2): 100 mg via INTRADERMAL

## 2015-12-15 MED ORDER — OXYCODONE HCL 5 MG PO TABS: 5.0000 mg | ORAL_TABLET | Freq: Once | ORAL | Status: DC | PRN

## 2015-12-15 MED ORDER — BUPIVACAINE HCL (PF) 0.5 % IJ SOLN
INTRAMUSCULAR | Status: DC | PRN
  Administered 2015-12-15: 10 mL

## 2015-12-15 MED ORDER — TETANUS-DIPHTH-ACELL PERTUSSIS 5-2.5-18.5 LF-MCG/0.5 IM SUSP: 0.5000 mL | Freq: Once | INTRAMUSCULAR | Status: DC

## 2015-12-15 MED ORDER — ONDANSETRON HCL 4 MG/2ML IJ SOLN: 4.0000 mg | Freq: Once | INTRAMUSCULAR | Status: DC | PRN

## 2015-12-15 MED ORDER — BUPIVACAINE 0.25 % ON-Q PUMP DUAL CATH 400 ML: 400.0000 mL | INJECTION | Status: DC

## 2015-12-15 MED ORDER — OXYTOCIN 40 UNITS IN LACTATED RINGERS INFUSION - SIMPLE MED: 2.5000 [IU]/h | INTRAVENOUS | Status: DC

## 2015-12-15 MED ORDER — KETOROLAC TROMETHAMINE 30 MG/ML IJ SOLN: 30.0000 mg | Freq: Four times a day (QID) | INTRAMUSCULAR | Status: AC | PRN

## 2015-12-15 MED ORDER — HYDROMORPHONE HCL 1 MG/ML IJ SOLN
INTRAMUSCULAR | Status: AC
  Administered 2015-12-15: 0.5 mg
  Filled 2015-12-15: qty 1

## 2015-12-15 MED ORDER — SODIUM CHLORIDE 0.9% FLUSH: 3.0000 mL | INTRAVENOUS | Status: DC | PRN

## 2015-12-15 MED ORDER — FENTANYL CITRATE (PF) 100 MCG/2ML IJ SOLN
INTRAMUSCULAR | Status: DC | PRN
  Administered 2015-12-15: 50 ug via INTRAVENOUS

## 2015-12-15 SURGICAL SUPPLY — 23 items
BARRIER ADHS 3X4 INTERCEED (GAUZE/BANDAGES/DRESSINGS) ×2 IMPLANT
CANISTER SUCT 3000ML (MISCELLANEOUS) ×2 IMPLANT
CATH KIT ON-Q SILVERSOAK 5IN (CATHETERS) ×4 IMPLANT
CHLORAPREP W/TINT 26ML (MISCELLANEOUS) ×2 IMPLANT
DRSG TELFA 3X8 NADH (GAUZE/BANDAGES/DRESSINGS) ×2 IMPLANT
ELECT CAUTERY BLADE 6.4 (BLADE) ×2 IMPLANT
ELECT REM PT RETURN 9FT ADLT (ELECTROSURGICAL) ×2
ELECTRODE REM PT RTRN 9FT ADLT (ELECTROSURGICAL) ×1 IMPLANT
GAUZE SPONGE 4X4 12PLY STRL (GAUZE/BANDAGES/DRESSINGS) IMPLANT
GLOVE BIO SURGEON STRL SZ8 (GLOVE) ×10 IMPLANT
GOWN STRL REUS W/ TWL LRG LVL3 (GOWN DISPOSABLE) ×2 IMPLANT
GOWN STRL REUS W/ TWL XL LVL3 (GOWN DISPOSABLE) ×1 IMPLANT
GOWN STRL REUS W/TWL LRG LVL3 (GOWN DISPOSABLE) ×2
GOWN STRL REUS W/TWL XL LVL3 (GOWN DISPOSABLE) ×1
NS IRRIG 1000ML POUR BTL (IV SOLUTION) ×2 IMPLANT
PACK C SECTION AR (MISCELLANEOUS) ×2 IMPLANT
PAD OB MATERNITY 4.3X12.25 (PERSONAL CARE ITEMS) ×2 IMPLANT
PAD PREP 24X41 OB/GYN DISP (PERSONAL CARE ITEMS) ×2 IMPLANT
STAPLER INSORB 30 2030 C-SECTI (MISCELLANEOUS) ×2 IMPLANT
STRAP SAFETY BODY (MISCELLANEOUS) ×2 IMPLANT
SUT CHROMIC 1 CTX 36 (SUTURE) ×6 IMPLANT
SUT PLAIN GUT 0 (SUTURE) ×4 IMPLANT
SUT VIC AB 0 CT1 36 (SUTURE) ×4 IMPLANT

## 2015-12-15 NOTE — Progress Notes (Signed)
RN to the bedside at 23:40 for decreased fetal heart rate according to centralized monitoring.  Upon entering the patient's room, audibile fetal heart rate in the 40s, peanut ball removed, nurse repositioned patient, pt. turned from right side to left side, FHR increase to 100s; pitocin off, fetal heart rate return to baseline of 120s. RN will continue to monitor.

## 2015-12-15 NOTE — Progress Notes (Signed)
S: Called by RN with update - at 0100 FHR Category 1 and Pitocin restarted at 0117 @ 6 milliunits - only one 30 minute period with adequate MVUs      At 0600, FHR showed several repetitive late decels on 14 milliunits of Pitocin - Pitocin off and repositioned by nurse  pt. Reports being more uncomfortable with low back pain - RN increased epidural and anesethia to come re-bolus  O:  VS: Blood pressure 133/88, pulse 72, temperature 98.5 F (36.9 C), temperature source Oral, resp. rate 16, height 5\' 5"  (1.651 m), weight 83.915 kg (185 lb).        FHR : baseline 135 bpm / variability Moderate / accelerations none / no decels         Toco: contractions every 3-5  minutes / Mild-Moderate / MVUs - inadequate at 130         Cervix : Dilation: 6 Effacement (%): 90 Cervical Position: Middle Station: 0 Presentation: Vertex Exam by:: Millner, RN         Membranes: AROM - light meconium   A: Active labor     FHR category 2  P: Pitocin off at 0605      Good return to baseline after Pitocin off with moderate variability       Has been 6 cm since 0330 without cervical change      Plan of care discussed with Dr. Feliberto GottronSchermerhorn - plan to proceed with primary low transverse cesarean delivery      Discussed plan of care with patient and family - everyone agrees with plan and all questions answered to their satisfaction     OR called and prepare for OR  Carlean JewsMeredith Jazmyne Beauchesne, CNM

## 2015-12-15 NOTE — Brief Op Note (Signed)
12/13/2015 - 12/15/2015  9:13 AM  PATIENT:  Holly Estrada  25 y.o. female  PRE-OPERATIVE DIAGNOSIS:  Active phase arrest  POST-OPERATIVE DIAGNOSIS:  same  PROCEDURE:  Procedure(s): CESAREAN SECTION (N/A)Primary low transverse cesarean section On q pump placement   SURGEON:  Surgeon(s) and Role:    Suzy Bouchard* Aslan Himes J Journee Bobrowski, MD - Primary  PHYSICIAN ASSISTANT: Sigmon  ASSISTANTS: none   ANESTHESIA:   general  EBL:  Total I/O In: 400 [I.V.:400] Out: 1000 [Urine:400; Blood:600] IOF 800 cc BLOOD ADMINISTERED:none  DRAINS: Urinary Catheter (Foley)   LOCAL MEDICATIONS USED:  MARCAINE     SPECIMEN:  No Specimen  DISPOSITION OF SPECIMEN:  N/A  COUNTS:  YES  TOURNIQUET:  * No tourniquets in log *  DICTATION: .Other Dictation: Dictation Number verbal  PLAN OF CARE: Admit to inpatient   PATIENT DISPOSITION:  PACU - hemodynamically stable.   Delay start of Pharmacological VTE agent (>24hrs) due to surgical blood loss or risk of bleeding: not applicable

## 2015-12-15 NOTE — Transfer of Care (Signed)
Immediate Anesthesia Transfer of Care Note  Patient: Holly Estrada  Procedure(s) Performed: Procedure(s): CESAREAN SECTION (N/A)  Patient Location: PACU and Women's Unit  Anesthesia Type:General  Level of Consciousness: awake  Airway & Oxygen Therapy: Patient Spontanous Breathing and Patient connected to face mask oxygen  Post-op Assessment: Report given to RN and Post -op Vital signs reviewed and stable  Post vital signs: stable  Last Vitals:  Filed Vitals:   12/15/15 0622 12/15/15 0923  BP: 133/88 142/75  Pulse: 72 117  Temp:  37.2 C  Resp: 16 12    Complications: No apparent anesthesia complications

## 2015-12-15 NOTE — Op Note (Signed)
NAMFarrell Ours:  Holly Estrada, Holly Estrada              ACCOUNT NO.:  [id number]  MEDICAL RECORD NO.:  [id number]  LOCATION:  [facility]                         FACILITY:  [facility]  PHYSICIAN:  Jennell Cornerhomas Lanore Renderos, MDDATE OF BIRTH:  [birth year]  DATE OF PROCEDURE: DATE OF DISCHARGE:                              OPERATIVE REPORT   PREOPERATIVE DIAGNOSIS: 1. 40 plus 4 weeks estimated gestational age. 2. Active phase arrest.  POSTOPERATIVE DIAGNOSIS: 1. 40 plus 4 weeks estimated gestational age. 2. Active phase arrest.  PROCEDURE PERFORMED: 1. Primary low transverse cesarean section. 2. ON-Q pump placement.  SURGEON:  Jennell Cornerhomas Ferlin Fairhurst, MD  ANESTHESIA:  Surgical dosing of epidural converted to general endotracheal anesthesia.  SURGEON:  Jennell Cornerhomas Kynnedi Zweig, MD.  FIRST ASSISTANT:  Sigmon, certified nurse midwife.  INDICATIONS:  A 25 year old, gravida 1, para 0, patient at 4440 plus 4 weeks estimated gestational age.  It is day #3 of induction when the patient did not progress past 6 cm despite intravenous Pitocin.  There were several time periods of fetal intolerance to labor.  DESCRIPTION OF PROCEDURE:  Surgical dosing of continuous lumbar epidural was performed.  The patient was prepped and draped in normal sterile fashion.  She did receive 2 g of IV Ancef prior to commencement of the case.  The patient was tested and continued to have sensation. Therefore, she underwent general endotracheal anesthesia.  Once under general endotracheal anesthesia was performed, a Pfannenstiel incision was made 2 fingerbreadths above the symphysis pubis.  Sharp dissection was used to identify the fascia.  The fascia was opened in the midline and opened in a transverse fashion.  The superior aspect of the fascia was grasped with Kocher clamps and the recti muscles were dissected free.  The inferior aspect of the fascia was grasped with Kocher clamps and pyramidalis muscles dissected free.  Entry into the  peritoneal cavity was accomplished sharply.  Bladder blade was used to reflect the bladder inferiorly and a low transverse uterine incision was made upon entry into the endometrial cavity.  Clear fluid resulted.  Fetal head was brought to the incision and vacuum was applied to the occiput with one gentle pull.  The fetal head was delivered.  Vacuum was removed and shoulders and body were delivered without difficulty.  Vigorous female was passed to the nursery staff, who assigned Apgar scores of 8 and 9. Fetal weight 3210 g, 7 pounds 1 ounce.  The placenta was manually delivered.  The uterus was exteriorized and endometrial cavity was wiped clean with laparotomy tape.  The uterine incision was closed with 1 chromic suture in a running, locking fashion.  Good approximation of edges.  Good hemostasis noted.  Two additional figure-of-eight sutures were required for hemostasis.  Fallopian tubes and ovaries appeared normal.  Posterior cul-de-sac was irrigated and suctioned.  The uterus was placed back in the abdominal cavity and the pericolic gutters were wiped clean with laparotomy tapes.  The uterine incision again appeared hemostatic.  Interceed was placed over the uterine incision in a T- shaped fashion.  The superior aspect of the fascia was regrasped with Kocher clamps and the ON-Q pump catheters were placed from infraumbilical position to a subfascial position.  The fascia was  then closed over top of these catheters with 0 Vicryl in a running, nonlocking fashion.  Good hemostasis was noted.  Subcutaneous tissues were irrigated and bovied, and the skin was reapproximated with Insorb absorbable sutures.  Good cosmetic effect resulted.  The ON-Q catheters were secured at the skin level with LiquiBand and the catheters were Steri-Stripped to the skin and Tegaderm was placed over the catheters. Each catheter was loaded with 0.5.% Marcaine, 5 mL delivered in each catheter.  There were no  complications.  ESTIMATED BLOOD LOSS:  600 mL.  INTRAOPERATIVE FLUIDS:  800 mL.  The patient did receive 0.2 mg Methergine intramuscular for mild uterine atony.  The patient tolerated the procedure well and was taken to recovery room in good condition.          ______________________________ Jennell Corner, MD     TS/MEDQ  D:  12/15/2015  T:  12/15/2015  Job:  [id number]

## 2015-12-15 NOTE — Progress Notes (Signed)
Pt has not progress past 6 cm for the past 3.5 hrs .meconium is noted . Unable to achieve adequate ctx pattern with Pitocin given fetal intolerance . Recommend proceeding with LTCS . I have spoken to the pt regarding these issues and she is in agreement of the surgery . Risks have been explained a, all questions answered .

## 2015-12-15 NOTE — Progress Notes (Addendum)
Pt. Verbalized lower back pain 4/10; anesthesiologist notified, OK to increase epidural infusion to 20 ml/hr (from 10) for one hour. Decrease rate back down to 10 ml/hr after an hour.

## 2015-12-16 ENCOUNTER — Encounter: Payer: Self-pay | Admitting: Obstetrics and Gynecology

## 2015-12-16 LAB — CBC
HCT: 24.6 % — ABNORMAL LOW (ref 35.0–47.0)
Hemoglobin: 8.5 g/dL — ABNORMAL LOW (ref 12.0–16.0)
MCH: 32.6 pg (ref 26.0–34.0)
MCHC: 34.5 g/dL (ref 32.0–36.0)
MCV: 94.7 fL (ref 80.0–100.0)
PLATELETS: 115 10*3/uL — AB (ref 150–440)
RBC: 2.6 MIL/uL — ABNORMAL LOW (ref 3.80–5.20)
RDW: 13 % (ref 11.5–14.5)
WBC: 17.9 10*3/uL — ABNORMAL HIGH (ref 3.6–11.0)

## 2015-12-16 MED ORDER — FERROUS SULFATE 325 (65 FE) MG PO TABS
325.0000 mg | ORAL_TABLET | Freq: Every day | ORAL | Status: DC
  Administered 2015-12-17 – 2015-12-18 (×2): 325 mg via ORAL
  Filled 2015-12-16 (×2): qty 1

## 2015-12-16 MED ORDER — PRENATAL MULTIVITAMIN CH: 1.0000 | ORAL_TABLET | Freq: Every day | ORAL | Status: DC

## 2015-12-16 NOTE — Anesthesia Postprocedure Evaluation (Signed)
Anesthesia Post Note  Patient: Holly Estrada  Procedure(s) Performed: Procedure(s) (LRB): CESAREAN SECTION (N/A)  Patient location during evaluation: Mother Baby Anesthesia Type: General and Epidural Level of consciousness: awake and alert and oriented Pain management: pain level controlled Vital Signs Assessment: post-procedure vital signs reviewed and stable Respiratory status: spontaneous breathing Cardiovascular status: stable Postop Assessment: no headache Anesthetic complications: no Comments: Epidural was dosed, inadequate block, converted to general without complication.    Last Vitals:  Filed Vitals:   12/15/15 2352 12/16/15 0320  BP: 120/77 113/63  Pulse: 86 78  Temp: 36.7 C 36.6 C  Resp: 20 20    Last Pain:  Filed Vitals:   12/16/15 [id number]  PainSc: 2                  Brysun Eschmann,  Alessandra BevelsJennifer M

## 2015-12-16 NOTE — Progress Notes (Signed)
Subjective:   Pt is feeling well and has not voided yet. Taking po well and pain is in control.   Objective:  Blood pressure 109/62, pulse 79, temperature 98.5 F (36.9 C), temperature source Oral, resp. rate 18, height '5\' 5"'  (1.651 m), weight 185 lb (83.915 kg), SpO2 97 %, unknown if currently breastfeeding.  General: NAD Pulmonary: no increased work of breathing, Lungs CTA bilat, no W/R/R Heart: S1S2, RRR, No M/R/G.  Abdomen: non-distended, non-tender, fundus firm at U-1 level of umbilicus Incision: C/D/I Dressing intact.  Extremities: no edema, no erythema, no tenderness  Results for orders placed or performed during the hospital encounter of 12/13/15 (from the past 72 hour(s))  CBC     Status: Abnormal   Collection Time: 12/13/15  9:13 PM  Result Value Ref Range   WBC 11.7 (H) 3.6 - 11.0 K/uL   RBC 3.67 (L) 3.80 - 5.20 MIL/uL   Hemoglobin 11.7 (L) 12.0 - 16.0 g/dL   HCT 34.2 (L) 35.0 - 47.0 %   MCV 93.3 80.0 - 100.0 fL   MCH 32.0 26.0 - 34.0 pg   MCHC 34.3 32.0 - 36.0 g/dL   RDW 12.8 11.5 - 14.5 %   Platelets 160 150 - 440 K/uL  Type and screen [place]     Status: None   Collection Time: 12/13/15  9:13 PM  Result Value Ref Range   ABO/RH(D) A POS    Antibody Screen NEG    Sample Expiration 12/16/2015   RPR     Status: None   Collection Time: 12/13/15  9:13 PM  Result Value Ref Range   RPR Ser Ql Non Reactive Non Reactive    Comment: (NOTE) Performed At: [facility] [place], [place] [id number] Lindon Romp MD [id number]   ABO/Rh     Status: None   Collection Time: 12/13/15  9:15 PM  Result Value Ref Range   ABO/RH(D) A POS   Platelet count     Status: Abnormal   Collection Time: 12/14/15  5:28 PM  Result Value Ref Range   Platelets 138 (L) 150 - 440 K/uL  Comprehensive metabolic panel     Status: Abnormal   Collection Time: 12/14/15  5:28 PM  Result Value Ref Range   Sodium 137 135 - 145 mmol/L   Potassium 3.7 3.5 - 5.1 mmol/L   Chloride 109 101 - 111 mmol/L   CO2 19 (L) 22 - 32 mmol/L   Glucose, Bld 79 65 - 99 mg/dL   BUN <5 (L) 6 - 20 mg/dL   Creatinine, Ser 0.57 0.44 - 1.00 mg/dL   Calcium 8.9 8.9 - 10.3 mg/dL   Total Protein 6.2 (L) 6.5 - 8.1 g/dL   Albumin 3.0 (L) 3.5 - 5.0 g/dL   AST 26 15 - 41 U/L   ALT 15 14 - 54 U/L   Alkaline Phosphatase 141 (H) 38 - 126 U/L   Total Bilirubin 0.4 0.3 - 1.2 mg/dL   GFR calc non Af Amer >60 >60 mL/min   GFR calc Af Amer >60 >60 mL/min    Comment: (NOTE) The eGFR has been calculated using the CKD EPI equation. This calculation has not been validated in all clinical situations. eGFR's persistently <60 mL/min signify possible Chronic Kidney Disease.    Anion gap 9 5 - 15  Uric acid     Status: None   Collection Time: 12/14/15  5:28 PM  Result Value Ref Range  Uric Acid, Serum 5.3 2.3 - 6.6 mg/dL  CBC     Status: Abnormal   Collection Time: 12/14/15  5:28 PM  Result Value Ref Range   WBC 13.1 (H) 3.6 - 11.0 K/uL   RBC 3.76 (L) 3.80 - 5.20 MIL/uL   Hemoglobin 12.3 12.0 - 16.0 g/dL   HCT 35.3 35.0 - 47.0 %   MCV 93.8 80.0 - 100.0 fL   MCH 32.7 26.0 - 34.0 pg   MCHC 34.8 32.0 - 36.0 g/dL   RDW 12.7 11.5 - 14.5 %   Platelets 140 (L) 150 - 440 K/uL  Protein / creatinine ratio, urine     Status: Abnormal   Collection Time: 12/14/15  6:08 PM  Result Value Ref Range   Creatinine, Urine 37 mg/dL   Total Protein, Urine 40 mg/dL    Comment: NO NORMAL RANGE ESTABLISHED FOR THIS TEST   Protein Creatinine Ratio 1.08 (H) 0.00 - 0.15 mg/mg[Cre]  CBC     Status: Abnormal   Collection Time: 12/16/15  5:42 AM  Result Value Ref Range   WBC 17.9 (H) 3.6 - 11.0 K/uL   RBC 2.60 (L) 3.80 - 5.20 MIL/uL   Hemoglobin 8.5 (L) 12.0 - 16.0 g/dL   HCT 24.6 (L) 35.0 - 47.0 %   MCV 94.7 80.0 - 100.0 fL   MCH 32.6 26.0 - 34.0 pg   MCHC 34.5 32.0 - 36.0 g/dL   RDW 13.0 11.5 - 14.5 %   Platelets 115 (L) 150 - 440 K/uL     Assessment:   25 y.o.  G1P1000 postoperativeday # 1, Stable, bleeding controlled, Afebrile and doing well   Plan:  1) Acute blood loss anemia - hemodynamically stable and asymptomatic - po ferrous sulfate  2) --/--/A POS (04/21 2115) / Immune (09/29 0000) / Varicella I, RI  3) Continue ON-Q pump and po Narcotics.   4) Continue PP care.

## 2015-12-17 LAB — CBC WITH DIFFERENTIAL/PLATELET
BASOS PCT: 0 %
Basophils Absolute: 0 10*3/uL (ref 0–0.1)
EOS ABS: 0.2 10*3/uL (ref 0–0.7)
EOS PCT: 1 %
HEMATOCRIT: 24 % — AB (ref 35.0–47.0)
Hemoglobin: 8.3 g/dL — ABNORMAL LOW (ref 12.0–16.0)
Lymphocytes Relative: 7 %
Lymphs Abs: 1.2 10*3/uL (ref 1.0–3.6)
MCH: 32.7 pg (ref 26.0–34.0)
MCHC: 34.4 g/dL (ref 32.0–36.0)
MCV: 95.1 fL (ref 80.0–100.0)
MONO ABS: 0.5 10*3/uL (ref 0.2–0.9)
MONOS PCT: 3 %
NEUTROS ABS: 15.8 10*3/uL — AB (ref 1.4–6.5)
Neutrophils Relative %: 89 %
Platelets: 136 10*3/uL — ABNORMAL LOW (ref 150–440)
RBC: 2.52 MIL/uL — ABNORMAL LOW (ref 3.80–5.20)
RDW: 13.1 % (ref 11.5–14.5)
WBC: 17.7 10*3/uL — ABNORMAL HIGH (ref 3.6–11.0)

## 2015-12-17 LAB — BASIC METABOLIC PANEL
Anion gap: 5 (ref 5–15)
BUN: 11 mg/dL (ref 6–20)
CALCIUM: 8 mg/dL — AB (ref 8.9–10.3)
CO2: 22 mmol/L (ref 22–32)
CREATININE: 0.73 mg/dL (ref 0.44–1.00)
Chloride: 110 mmol/L (ref 101–111)
GFR calc non Af Amer: >60 mL/min (ref >60)
Glucose, Bld: 84 mg/dL (ref 65–99)
Potassium: 3.7 mmol/L (ref 3.5–5.1)
Sodium: 137 mmol/L (ref 135–145)

## 2015-12-17 NOTE — Progress Notes (Signed)
Post op Day 2 Subjective: no complaints  Objective: Blood pressure 133/82, pulse 80, temperature 98.5 F (36.9 C), temperature source Oral, resp. rate 17, height 5\' 5"  (1.651 m), weight 83.915 kg (185 lb), SpO2 100 %, unknown if currently breastfeeding.  Physical Exam:  General: alert, cooperative and no distress Lochia: appropriate Uterine Fundus: firm Incision: healing well, no significant drainage, no dehiscence DVT Evaluation: No evidence of DVT seen on physical exam. Negative Homan's sign.   Recent Labs  12/16/15 0542 12/17/15 0854  HGB 8.5* 8.3*  HCT 24.6* 24.0*    Assessment/Plan: Breastfeeding  Advance diet Po meds Encourage ambulation    LOS: 4 days   Holly Estrada 12/17/2015, 11:10 PM

## 2015-12-18 LAB — URINALYSIS COMPLETE WITH MICROSCOPIC (ARMC ONLY)
Bilirubin Urine: NEGATIVE
Glucose, UA: NEGATIVE mg/dL
KETONES UR: NEGATIVE mg/dL
Nitrite: NEGATIVE
PH: 6 (ref 5.0–8.0)
PROTEIN: NEGATIVE mg/dL
Specific Gravity, Urine: 1.011 (ref 1.005–1.030)

## 2015-12-18 MED ORDER — OXYCODONE-ACETAMINOPHEN 5-325 MG PO TABS: 1.0000 | ORAL_TABLET | ORAL | Status: DC | PRN

## 2015-12-18 MED ORDER — IBUPROFEN 600 MG PO TABS: 600.0000 mg | ORAL_TABLET | Freq: Four times a day (QID) | ORAL | Status: DC

## 2015-12-18 MED ORDER — BREAST MILK
ORAL | Status: DC
  Filled 2015-12-18 (×20): qty 1

## 2015-12-18 NOTE — Discharge Instructions (Signed)

## 2015-12-18 NOTE — Discharge Summary (Signed)
Obstetric Discharge Summary   Patient ID: Holly Estrada MRN: [id number] DOB/AGE: 26-Mar-1991 25 y.o.   Date of Admission: 12/13/2015  Date of Discharge: 12/18/15  Admitting Diagnosis: Induction of labor at 2059w4d  Secondary Diagnosis: Anemia in pregnancy  Mode of Delivery: primary low transverse cesarean section with vacuum deliverylow uterine, transverse     Discharge Diagnosis: Reasons for cesarean section  Arrest of Descent, Arrest of Dilation and Fetal intolerance to labor, Acute blood loss anemia compounding IDA   Intrapartum Procedures: Atificial rupture of membranes, epidural, pitocin augmentation, placement of intrauterine catheter and Cervidil   Post partum procedures: Tdap, Oral FE  Complications: none   Brief Hospital Course   Cesarean Section: Holly Estrada is a G1P1000 who underwent cesarean section on 12/15/2015 for failure to progress and fetal intolerance to labor after a postdates induction of labor.  Patient had an uncomplicated surgery; for further details of this surgery, please refer to the operative note.  Patient had an uncomplicated postpartum course.  By time of discharge on POD# 3, her pain was controlled on oral pain medications; she had appropriate lochia and was ambulating, voiding without difficulty, tolerating regular diet and passing flatus.   She was deemed stable for discharge to home.    Labs: CBC Latest Ref Rng 12/17/2015 12/16/2015 12/14/2015  WBC 3.6 - 11.0 K/uL 17.7(H) 17.9(H) 13.1(H)  Hemoglobin 12.0 - 16.0 g/dL 8.3(L) 8.5(L) 12.3  Hematocrit 35.0 - 47.0 % 24.0(L) 24.6(L) 35.3  Platelets 150 - 440 K/uL 136(L) 115(L) 140(L)   A POS   Results for orders placed or performed during the hospital encounter of 12/13/15 (from the past 24 hour(s))  Urinalysis complete, with microscopic ([facility] only)     Status: Abnormal   Collection Time: 12/18/15 10:56 AM  Result Value Ref Range   Color, Urine YELLOW (A) YELLOW   APPearance CLEAR (A) CLEAR    Glucose, UA NEGATIVE NEGATIVE mg/dL   Bilirubin Urine NEGATIVE NEGATIVE   Ketones, ur NEGATIVE NEGATIVE mg/dL   Specific Gravity, Urine 1.011 1.005 - 1.030   Hgb urine dipstick 3+ (A) NEGATIVE   pH 6.0 5.0 - 8.0   Protein, ur NEGATIVE NEGATIVE mg/dL   Nitrite NEGATIVE NEGATIVE   Leukocytes, UA 1+ (A) NEGATIVE   RBC / HPF TOO NUMEROUS TO COUNT 0 - 5 RBC/hpf   WBC, UA 6-30 0 - 5 WBC/hpf   Bacteria, UA RARE (A) NONE SEEN   Squamous Epithelial / LPF 0-5 (A) NONE SEEN   Mucous PRESENT    WBC count is elevated at 17.7 on 4/25, no evidence of infection, UA WNL, VSS Strict precautions given to patient for s/s of infection - pt. Verbalizes understanding   Physical exam:  Blood pressure 130/81, pulse 77, temperature 98.8 F (37.1 C), temperature source Oral, resp. rate 18, height 5\' 5"  (1.651 m), weight 83.915 kg (185 lb), SpO2 99 %, unknown if currently breastfeeding. General: alert and no distress Lochia: appropriate Abdomen: soft, NT Uterine Fundus: firm ncision: healing well, no significant drainage, no dehiscence, no significant erythema Extremities: No evidence of DVT seen on physical exam. No lower extremity edema.  Discharge Instructions: Per After Visit Summary. Discharge Instructions    Call MD for:  difficulty breathing, headache or visual disturbances    Complete by:  As directed      Call MD for:  extreme fatigue    Complete by:  As directed      Call MD for:  hives    Complete by:  As directed      Call MD for:  persistant dizziness or light-headedness    Complete by:  As directed      Call MD for:  persistant nausea and vomiting    Complete by:  As directed      Call MD for:  redness, tenderness, or signs of infection (pain, swelling, redness, odor or green/yellow discharge around incision site)    Complete by:  As directed      Call MD for:  severe uncontrolled pain    Complete by:  As directed      Call MD for:  temperature >100.4    Complete by:  As directed       Call MD for:    Complete by:  As directed   Worsening s/s of postpartum depression, unable to care for yourself or your infant, suicidal or homicidal thoughts     Diet - low sodium heart healthy    Complete by:  As directed      Discharge wound care:    Complete by:  As directed   Leave incision open to air, do not pick off skin glue, okay for warm soap and water wash over incision, pat dry     Driving restriction     Complete by:  As directed   Avoid driving for at least 2 weeks or while you are taking narcotic pain medication     Lifting restrictions    Complete by:  As directed   Weight restriction of 10 lbs.     No dressing needed    Complete by:  As directed      Sexual acrtivity    Complete by:  As directed   No intercourse for 6 weeks          Activity: Advance as tolerated. Pelvic rest for 6 weeks.  Also refer to After Visit Summary Diet: Regular Medications:   Medication List    TAKE these medications        ferrous sulfate 325 (65 FE) MG tablet  Take 325 mg by mouth daily with breakfast.     ibuprofen 600 MG tablet  Commonly known as:  ADVIL,MOTRIN  Take 1 tablet (600 mg total) by mouth every 6 (six) hours.     oxyCODONE-acetaminophen 5-325 MG tablet  Commonly known as:  PERCOCET/ROXICET  Take 1-2 tablets by mouth every 4 (four) hours as needed (pain scale > 7).     prenatal multivitamin Tabs tablet  Take 1 tablet by mouth daily at 12 noon.       Outpatient follow up:      Follow-up Information    Follow up with SCHERMERHORN,THOMAS, MD. Schedule an appointment as soon as possible for a visit in 2 weeks.   Specialty:  Obstetrics and Gynecology   Why:  Post-op visit   Contact information:   [address] [place] [address] [phone]      Postpartum contraception: oral contraceptives (estrogen/progesterone)  Discharged Condition: good  Discharged to: home   Newborn Data:  Baby Girl "Chloe"  named  Disposition:home with mother  Apgars: APGAR (1 MIN): 8   APGAR (5 MINS): 9    Baby Feeding: Breast  Karena Addison, CNM 12/18/2015

## 2015-12-19 LAB — URINE CULTURE: Culture: 6000 — AB

## 2015-12-19 NOTE — Final Progress Note (Signed)
Pt discharged from triage with Cat I strip, intact membranes, no contractions and all normal vital signs.

## 2019-01-30 LAB — OB RESULTS CONSOLE HEPATITIS B SURFACE ANTIGEN: Hepatitis B Surface Ag: NEGATIVE

## 2019-01-30 LAB — OB RESULTS CONSOLE GC/CHLAMYDIA
Chlamydia: NEGATIVE
Gonorrhea: NEGATIVE

## 2019-01-30 LAB — OB RESULTS CONSOLE RPR: RPR: NONREACTIVE

## 2019-01-30 LAB — OB RESULTS CONSOLE RUBELLA ANTIBODY, IGM: Rubella: IMMUNE

## 2019-01-30 LAB — OB RESULTS CONSOLE ANTIBODY SCREEN: Antibody Screen: NEGATIVE

## 2019-01-30 LAB — OB RESULTS CONSOLE ABO/RH: RH Type: POSITIVE

## 2019-01-30 LAB — OB RESULTS CONSOLE HIV ANTIBODY (ROUTINE TESTING): HIV: NONREACTIVE

## 2019-05-19 ENCOUNTER — Ambulatory Visit: Payer: Self-pay | Admitting: Cardiology

## 2019-05-31 ENCOUNTER — Ambulatory Visit: Payer: Self-pay | Admitting: Cardiology

## 2019-06-07 ENCOUNTER — Ambulatory Visit: Payer: Self-pay | Admitting: Cardiology

## 2019-06-07 DIAGNOSIS — R002 Palpitations: Secondary | ICD-10-CM | POA: Insufficient documentation

## 2019-06-07 NOTE — Progress Notes (Deleted)
Patient referred by Darliss Cheney, CNM for palpitations  Subjective:   Holly Estrada, female    DOB: 06/21/1991, 28 y.o.   MRN: [id number]   No chief complaint on file.   ***  HPI  28 y.o. *** female with third trimester pregnancy (EDD 08/30/2019), h/o previous C-section complicating pregnancy, h/op chornic hypertension in pregnancy, h/o Guillain-Barre syndrome, family h/o congenital heart disease, now referred for evaluation of palpitations.   *** Past Medical History:  Diagnosis Date  . Anemia   . Guillain Barr syndrome [facility]) 2013    *** Past Surgical History:  Procedure Laterality Date  . CESAREAN SECTION N/A 12/15/2015   Procedure: CESAREAN SECTION;  Surgeon: Boykin Nearing, MD;  Location: [facility] ORS;  Service: Obstetrics;  Laterality: N/A;  . NO PAST SURGERIES      *** Social History   Socioeconomic History  . Marital status: Single    Spouse name: Not on file  . Number of children: Not on file  . Years of education: Not on file  . Highest education level: Not on file  Occupational History  . Not on file  Social Needs  . Financial resource strain: Not on file  . Food insecurity    Worry: Not on file    Inability: Not on file  . Transportation needs    Medical: Not on file    Non-medical: Not on file  Tobacco Use  . Smoking status: Never Smoker  . Smokeless tobacco: Never Used  Substance and Sexual Activity  . Alcohol use: No  . Drug use: No  . Sexual activity: Yes  Lifestyle  . Physical activity    Days per week: Not on file    Minutes per session: Not on file  . Stress: Not on file  Relationships  . Social Herbalist on phone: Not on file    Gets together: Not on file    Attends religious service: Not on file    Active member of club or organization: Not on file    Attends meetings of clubs or organizations: Not on file    Relationship status: Not on file  . Intimate partner violence    Fear of current or ex partner:  Not on file    Emotionally abused: Not on file    Physically abused: Not on file    Forced sexual activity: Not on file  Other Topics Concern  . Not on file  Social History Narrative  . Not on file    *** Family History  Problem Relation Age of Onset  . Kidney failure Maternal Grandfather     *** Current Outpatient Medications on File Prior to Visit  Medication Sig Dispense Refill  . ferrous sulfate 325 (65 FE) MG tablet Take 325 mg by mouth daily with breakfast.    . ibuprofen (ADVIL,MOTRIN) 600 MG tablet Take 1 tablet (600 mg total) by mouth every 6 (six) hours. 30 tablet 0  . oxyCODONE-acetaminophen (PERCOCET/ROXICET) 5-325 MG tablet Take 1-2 tablets by mouth every 4 (four) hours as needed (pain scale > 7). 20 tablet 0  . Prenatal Vit-Fe Fumarate-FA (PRENATAL MULTIVITAMIN) TABS tablet Take 1 tablet by mouth daily at 12 noon.     No current facility-administered medications on file prior to visit.     Cardiovascular studies:  ***  *** Recent labs: 04/12/2019: Glucose 80. BUN/Cr 7/0.58. eGFR normal. Na/K 136/3.9. Rest of the CMP normal.  H/H 10.5/29.7. MCV 88. Platelets 157.   ***  ROS      *** There were no vitals filed for this visit.   There is no height or weight on file to calculate BMI. There were no vitals filed for this visit.  *** Objective:   Physical Exam        Assessment & Recommendations:   ***  ***   Thank you for referring the patient to [place]. Please feel free to contact with any questions.  Nigel Mormon, MD [facility] Pager: [phone] Office: [phone] If no answer Cell [phone]

## 2019-06-16 ENCOUNTER — Ambulatory Visit: Payer: Self-pay | Admitting: Cardiology

## 2019-06-29 ENCOUNTER — Ambulatory Visit: Payer: Self-pay | Admitting: Cardiology

## 2019-08-08 ENCOUNTER — Other Ambulatory Visit: Payer: Self-pay | Admitting: Obstetrics and Gynecology

## 2019-08-10 ENCOUNTER — Encounter (HOSPITAL_COMMUNITY): Payer: Self-pay

## 2019-08-10 NOTE — Patient Instructions (Signed)
Melaina Howerton  08/10/2019   Your procedure is scheduled on:  08/22/2019   Arrive at 1:15 PM at Entrance C on [address] at [facility]  and [employer]. You are invited to use the FREE valet parking or use the Visitor's parking deck.  Pick up the phone at the desk and dial [phone].  Call this number if you have problems the morning of surgery: [phone]  Remember:   Do not eat food:(After Midnight) Desps de medianoche.  Do not drink clear liquids: (After Midnight) Desps de medianoche.  Take these medicines the morning of surgery with A SIP OF WATER:  none   Do not wear jewelry, make-up or nail polish.  Do not wear lotions, powders, or perfumes. Do not wear deodorant.  Do not shave 48 hours prior to surgery.  Do not bring valuables to the hospital.  [facility] is not   responsible for any belongings or valuables brought to the hospital.  Contacts, dentures or bridgework may not be worn into surgery.  Leave suitcase in the car. After surgery it may be brought to your room.  For patients admitted to the hospital, checkout time is 11:00 AM the day of              discharge.      Please read over the following fact sheets that you were given:     Preparing for Surgery

## 2019-08-11 ENCOUNTER — Encounter (HOSPITAL_COMMUNITY): Payer: Self-pay

## 2019-08-20 ENCOUNTER — Other Ambulatory Visit (HOSPITAL_COMMUNITY)
Admission: RE | Admit: 2019-08-20 | Discharge: 2019-08-20 | Disposition: A | Discharge status: Home / Self Care | Payer: BC Managed Care – PPO | Source: Ambulatory Visit | Attending: Obstetrics and Gynecology | Admitting: Obstetrics and Gynecology

## 2019-08-20 ENCOUNTER — Other Ambulatory Visit: Payer: Self-pay

## 2019-08-20 DIAGNOSIS — Z20828 Contact with and (suspected) exposure to other viral communicable diseases: Secondary | ICD-10-CM | POA: Insufficient documentation

## 2019-08-20 HISTORY — DX: Other complications of anesthesia, initial encounter: T88.59XA

## 2019-08-20 HISTORY — DX: Essential (primary) hypertension: I10

## 2019-08-20 LAB — CBC
HCT: 32.3 % — ABNORMAL LOW (ref 36.0–46.0)
Hemoglobin: 11.5 g/dL — ABNORMAL LOW (ref 12.0–15.0)
MCH: 33 pg (ref 26.0–34.0)
MCHC: 35.6 g/dL (ref 30.0–36.0)
MCV: 92.8 fL (ref 80.0–100.0)
Platelets: 144 10*3/uL — ABNORMAL LOW (ref 150–400)
RBC: 3.48 MIL/uL — ABNORMAL LOW (ref 3.87–5.11)
RDW: 13 % (ref 11.5–15.5)
WBC: 10.3 10*3/uL (ref 4.0–10.5)
nRBC: 0 % (ref 0.0–0.2)

## 2019-08-20 LAB — SARS CORONAVIRUS 2 (TAT 6-24 HRS): SARS Coronavirus 2: NEGATIVE

## 2019-08-20 LAB — TYPE AND SCREEN
ABO/RH(D): A POS
Antibody Screen: NEGATIVE

## 2019-08-20 LAB — RPR: RPR Ser Ql: NONREACTIVE

## 2019-08-20 LAB — ABO/RH: ABO/RH(D): A POS

## 2019-08-20 NOTE — MAU Note (Signed)
Patient denies any covid symptoms nor travel in the last 14 days. Patient given CHG wash and instructions. Discussed need to keep blue band on and not remove. Visitation policy also reviewed based on potential COVID results. Phlebotomy has drawn labs.

## 2019-08-22 ENCOUNTER — Encounter (HOSPITAL_COMMUNITY)
Admission: RE | Disposition: A | Discharge status: Home / Self Care | Payer: Self-pay | Source: Home / Self Care | Attending: Obstetrics and Gynecology

## 2019-08-22 ENCOUNTER — Inpatient Hospital Stay (HOSPITAL_COMMUNITY)
Admission: RE | Admit: 2019-08-22 | Discharge: 2019-08-24 | DRG: 787 | Disposition: A | Discharge status: Home / Self Care | Payer: BC Managed Care – PPO | Attending: Obstetrics and Gynecology | Admitting: Obstetrics and Gynecology

## 2019-08-22 ENCOUNTER — Inpatient Hospital Stay (HOSPITAL_COMMUNITY): Payer: BC Managed Care – PPO | Admitting: Certified Registered Nurse Anesthetist

## 2019-08-22 ENCOUNTER — Encounter (HOSPITAL_COMMUNITY): Payer: Self-pay | Admitting: Obstetrics and Gynecology

## 2019-08-22 ENCOUNTER — Other Ambulatory Visit: Payer: Self-pay

## 2019-08-22 DIAGNOSIS — O34211 Maternal care for low transverse scar from previous cesarean delivery: Secondary | ICD-10-CM | POA: Diagnosis present

## 2019-08-22 DIAGNOSIS — O9912 Other diseases of the blood and blood-forming organs and certain disorders involving the immune mechanism complicating childbirth: Secondary | ICD-10-CM | POA: Diagnosis present

## 2019-08-22 DIAGNOSIS — O1002 Pre-existing essential hypertension complicating childbirth: Secondary | ICD-10-CM | POA: Diagnosis present

## 2019-08-22 DIAGNOSIS — O9081 Anemia of the puerperium: Secondary | ICD-10-CM | POA: Diagnosis not present

## 2019-08-22 DIAGNOSIS — D6959 Other secondary thrombocytopenia: Secondary | ICD-10-CM | POA: Diagnosis present

## 2019-08-22 DIAGNOSIS — O339 Maternal care for disproportion, unspecified: Secondary | ICD-10-CM | POA: Diagnosis present

## 2019-08-22 DIAGNOSIS — Z98891 History of uterine scar from previous surgery: Secondary | ICD-10-CM

## 2019-08-22 DIAGNOSIS — D62 Acute posthemorrhagic anemia: Secondary | ICD-10-CM | POA: Diagnosis not present

## 2019-08-22 DIAGNOSIS — O34219 Maternal care for unspecified type scar from previous cesarean delivery: Secondary | ICD-10-CM | POA: Diagnosis present

## 2019-08-22 DIAGNOSIS — Z3A39 39 weeks gestation of pregnancy: Secondary | ICD-10-CM

## 2019-08-22 LAB — COMPREHENSIVE METABOLIC PANEL
ALT: 17 U/L (ref 0–44)
AST: 25 U/L (ref 15–41)
Albumin: 2.8 g/dL — ABNORMAL LOW (ref 3.5–5.0)
Alkaline Phosphatase: 121 U/L (ref 38–126)
Anion gap: 8 (ref 5–15)
BUN: 5 mg/dL — ABNORMAL LOW (ref 6–20)
CO2: 21 mmol/L — ABNORMAL LOW (ref 22–32)
Calcium: 8.5 mg/dL — ABNORMAL LOW (ref 8.9–10.3)
Chloride: 107 mmol/L (ref 98–111)
Creatinine, Ser: 0.54 mg/dL (ref 0.44–1.00)
GFR calc Af Amer: >60 mL/min (ref >60)
GFR calc non Af Amer: >60 mL/min (ref >60)
Glucose, Bld: 81 mg/dL (ref 70–99)
Potassium: 4.2 mmol/L (ref 3.5–5.1)
Sodium: 136 mmol/L (ref 135–145)
Total Bilirubin: 0.7 mg/dL (ref 0.3–1.2)
Total Protein: 6 g/dL — ABNORMAL LOW (ref 6.5–8.1)

## 2019-08-22 SURGERY — Surgical Case
Anesthesia: Spinal | Site: Abdomen | Wound class: Clean Contaminated

## 2019-08-22 MED ORDER — SODIUM CHLORIDE 0.9% FLUSH: 3.0000 mL | INTRAVENOUS | Status: DC | PRN

## 2019-08-22 MED ORDER — SIMETHICONE 80 MG PO CHEW
80.0000 mg | CHEWABLE_TABLET | ORAL | Status: DC
  Administered 2019-08-23: 80 mg via ORAL
  Filled 2019-08-22 (×2): qty 1

## 2019-08-22 MED ORDER — STERILE WATER FOR IRRIGATION IR SOLN
Status: DC | PRN
  Administered 2019-08-22: 1000 mL

## 2019-08-22 MED ORDER — DEXAMETHASONE SODIUM PHOSPHATE 4 MG/ML IJ SOLN
INTRAMUSCULAR | Status: DC | PRN
  Administered 2019-08-22: 4 mg via INTRAVENOUS

## 2019-08-22 MED ORDER — MENTHOL 3 MG MT LOZG: 1.0000 | LOZENGE | OROMUCOSAL | Status: DC | PRN

## 2019-08-22 MED ORDER — KETOROLAC TROMETHAMINE 30 MG/ML IJ SOLN
INTRAMUSCULAR | Status: AC
  Filled 2019-08-22: qty 1

## 2019-08-22 MED ORDER — CEFAZOLIN SODIUM-DEXTROSE 2-4 GM/100ML-% IV SOLN
2.0000 g | INTRAVENOUS | Status: AC
  Administered 2019-08-22: 2 g via INTRAVENOUS

## 2019-08-22 MED ORDER — NALBUPHINE HCL 10 MG/ML IJ SOLN: 5.0000 mg | INTRAMUSCULAR | Status: DC | PRN

## 2019-08-22 MED ORDER — NALBUPHINE HCL 10 MG/ML IJ SOLN: 5.0000 mg | Freq: Once | INTRAMUSCULAR | Status: DC | PRN

## 2019-08-22 MED ORDER — SODIUM CHLORIDE 0.9 % IR SOLN
Status: DC | PRN
  Administered 2019-08-22: 1000 mL

## 2019-08-22 MED ORDER — MORPHINE SULFATE (PF) 0.5 MG/ML IJ SOLN
INTRAMUSCULAR | Status: AC
  Filled 2019-08-22: qty 10

## 2019-08-22 MED ORDER — PHENYLEPHRINE HCL-NACL 20-0.9 MG/250ML-% IV SOLN
INTRAVENOUS | Status: DC | PRN
  Administered 2019-08-22: 60 ug/min via INTRAVENOUS

## 2019-08-22 MED ORDER — LACTATED RINGERS IV SOLN
INTRAVENOUS | Status: DC
  Administered 2019-08-22: 125 mL/h via INTRAVENOUS

## 2019-08-22 MED ORDER — SIMETHICONE 80 MG PO CHEW: 80.0000 mg | CHEWABLE_TABLET | ORAL | Status: DC | PRN

## 2019-08-22 MED ORDER — ZOLPIDEM TARTRATE 5 MG PO TABS: 5.0000 mg | ORAL_TABLET | Freq: Every evening | ORAL | Status: DC | PRN

## 2019-08-22 MED ORDER — MORPHINE SULFATE (PF) 0.5 MG/ML IJ SOLN
INTRAMUSCULAR | Status: DC | PRN
  Administered 2019-08-22: .15 mg via INTRATHECAL

## 2019-08-22 MED ORDER — OXYCODONE HCL 5 MG/5ML PO SOLN: 5.0000 mg | Freq: Once | ORAL | Status: DC | PRN

## 2019-08-22 MED ORDER — DIPHENHYDRAMINE HCL 25 MG PO CAPS: 25.0000 mg | ORAL_CAPSULE | Freq: Four times a day (QID) | ORAL | Status: DC | PRN

## 2019-08-22 MED ORDER — SCOPOLAMINE 1 MG/3DAYS TD PT72
MEDICATED_PATCH | TRANSDERMAL | Status: AC
  Filled 2019-08-22: qty 1

## 2019-08-22 MED ORDER — LACTATED RINGERS IV SOLN: INTRAVENOUS | Status: DC

## 2019-08-22 MED ORDER — METHYLERGONOVINE MALEATE 0.2 MG/ML IJ SOLN: 0.2000 mg | INTRAMUSCULAR | Status: DC | PRN

## 2019-08-22 MED ORDER — KETOROLAC TROMETHAMINE 30 MG/ML IJ SOLN
30.0000 mg | Freq: Four times a day (QID) | INTRAMUSCULAR | Status: AC
  Administered 2019-08-23 (×2): 30 mg via INTRAVENOUS
  Filled 2019-08-22 (×2): qty 1

## 2019-08-22 MED ORDER — OXYCODONE HCL 5 MG PO TABS: 5.0000 mg | ORAL_TABLET | Freq: Once | ORAL | Status: DC | PRN

## 2019-08-22 MED ORDER — NALOXONE HCL 0.4 MG/ML IJ SOLN: 0.4000 mg | INTRAMUSCULAR | Status: DC | PRN

## 2019-08-22 MED ORDER — DEXAMETHASONE SODIUM PHOSPHATE 4 MG/ML IJ SOLN
INTRAMUSCULAR | Status: AC
  Filled 2019-08-22: qty 1

## 2019-08-22 MED ORDER — FENTANYL CITRATE (PF) 100 MCG/2ML IJ SOLN
INTRAMUSCULAR | Status: AC
  Filled 2019-08-22: qty 2

## 2019-08-22 MED ORDER — SENNOSIDES-DOCUSATE SODIUM 8.6-50 MG PO TABS
2.0000 | ORAL_TABLET | ORAL | Status: DC
  Administered 2019-08-23: 2 via ORAL
  Filled 2019-08-22 (×2): qty 2

## 2019-08-22 MED ORDER — KETOROLAC TROMETHAMINE 30 MG/ML IJ SOLN
30.0000 mg | Freq: Once | INTRAMUSCULAR | Status: AC | PRN
  Administered 2019-08-22: 30 mg via INTRAVENOUS

## 2019-08-22 MED ORDER — NALOXONE HCL 4 MG/10ML IJ SOLN
1.0000 ug/kg/h | INTRAVENOUS | Status: DC | PRN
  Filled 2019-08-22: qty 5

## 2019-08-22 MED ORDER — OXYTOCIN 40 UNITS IN NORMAL SALINE INFUSION - SIMPLE MED
INTRAVENOUS | Status: DC | PRN
  Administered 2019-08-22: 600 mL via INTRAVENOUS

## 2019-08-22 MED ORDER — FENTANYL CITRATE (PF) 100 MCG/2ML IJ SOLN: 25.0000 ug | INTRAMUSCULAR | Status: DC | PRN

## 2019-08-22 MED ORDER — MEPERIDINE HCL 25 MG/ML IJ SOLN: 6.2500 mg | INTRAMUSCULAR | Status: DC | PRN

## 2019-08-22 MED ORDER — ONDANSETRON HCL 4 MG/2ML IJ SOLN
INTRAMUSCULAR | Status: AC
  Filled 2019-08-22: qty 2

## 2019-08-22 MED ORDER — DIPHENHYDRAMINE HCL 25 MG PO CAPS: 25.0000 mg | ORAL_CAPSULE | ORAL | Status: DC | PRN

## 2019-08-22 MED ORDER — OXYTOCIN 40 UNITS IN NORMAL SALINE INFUSION - SIMPLE MED
INTRAVENOUS | Status: AC
  Filled 2019-08-22: qty 1000

## 2019-08-22 MED ORDER — IBUPROFEN 800 MG PO TABS
800.0000 mg | ORAL_TABLET | Freq: Four times a day (QID) | ORAL | Status: DC
  Administered 2019-08-23 – 2019-08-24 (×5): 800 mg via ORAL
  Filled 2019-08-22 (×5): qty 1

## 2019-08-22 MED ORDER — CEFAZOLIN SODIUM-DEXTROSE 2-4 GM/100ML-% IV SOLN
INTRAVENOUS | Status: AC
  Filled 2019-08-22: qty 100

## 2019-08-22 MED ORDER — PHENYLEPHRINE HCL-NACL 20-0.9 MG/250ML-% IV SOLN
INTRAVENOUS | Status: AC
  Filled 2019-08-22: qty 250

## 2019-08-22 MED ORDER — ONDANSETRON HCL 4 MG/2ML IJ SOLN
4.0000 mg | Freq: Once | INTRAMUSCULAR | Status: AC
  Administered 2019-08-22: 4 mg via INTRAVENOUS
  Filled 2019-08-22: qty 2

## 2019-08-22 MED ORDER — METHYLERGONOVINE MALEATE 0.2 MG PO TABS: 0.2000 mg | ORAL_TABLET | ORAL | Status: DC | PRN

## 2019-08-22 MED ORDER — ONDANSETRON HCL 4 MG/2ML IJ SOLN: 4.0000 mg | Freq: Once | INTRAMUSCULAR | Status: DC | PRN

## 2019-08-22 MED ORDER — DIPHENHYDRAMINE HCL 50 MG/ML IJ SOLN: 12.5000 mg | INTRAMUSCULAR | Status: DC | PRN

## 2019-08-22 MED ORDER — LACTATED RINGERS IV SOLN: INTRAVENOUS | Status: DC | PRN

## 2019-08-22 MED ORDER — TETANUS-DIPHTH-ACELL PERTUSSIS 5-2.5-18.5 LF-MCG/0.5 IM SUSP: 0.5000 mL | Freq: Once | INTRAMUSCULAR | Status: DC

## 2019-08-22 MED ORDER — BUPIVACAINE IN DEXTROSE 0.75-8.25 % IT SOLN
INTRATHECAL | Status: DC | PRN
  Administered 2019-08-22: 1.6 mL via INTRATHECAL

## 2019-08-22 MED ORDER — ONDANSETRON HCL 4 MG/2ML IJ SOLN: 4.0000 mg | Freq: Three times a day (TID) | INTRAMUSCULAR | Status: DC | PRN

## 2019-08-22 MED ORDER — SIMETHICONE 80 MG PO CHEW
80.0000 mg | CHEWABLE_TABLET | Freq: Three times a day (TID) | ORAL | Status: DC
  Administered 2019-08-23 – 2019-08-24 (×3): 80 mg via ORAL
  Filled 2019-08-22 (×3): qty 1

## 2019-08-22 MED ORDER — COCONUT OIL OIL
1.0000 "application " | TOPICAL_OIL | Status: DC | PRN
  Administered 2019-08-24: 1 via TOPICAL

## 2019-08-22 MED ORDER — SCOPOLAMINE 1 MG/3DAYS TD PT72
1.0000 | MEDICATED_PATCH | Freq: Once | TRANSDERMAL | Status: DC
  Administered 2019-08-22: 1.5 mg via TRANSDERMAL

## 2019-08-22 MED ORDER — ONDANSETRON HCL 4 MG/2ML IJ SOLN
INTRAMUSCULAR | Status: DC | PRN
  Administered 2019-08-22: 4 mg via INTRAVENOUS

## 2019-08-22 MED ORDER — BUPIVACAINE HCL (PF) 0.25 % IJ SOLN
INTRAMUSCULAR | Status: AC
  Filled 2019-08-22: qty 20

## 2019-08-22 MED ORDER — OXYTOCIN 40 UNITS IN NORMAL SALINE INFUSION - SIMPLE MED: 2.5000 [IU]/h | INTRAVENOUS | Status: AC

## 2019-08-22 MED ORDER — OXYCODONE-ACETAMINOPHEN 5-325 MG PO TABS: 1.0000 | ORAL_TABLET | ORAL | Status: DC | PRN

## 2019-08-22 MED ORDER — FENTANYL CITRATE (PF) 100 MCG/2ML IJ SOLN
INTRAMUSCULAR | Status: DC | PRN
  Administered 2019-08-22: 15 ug via INTRATHECAL

## 2019-08-22 MED ORDER — PRENATAL MULTIVITAMIN CH
1.0000 | ORAL_TABLET | Freq: Every day | ORAL | Status: DC
  Administered 2019-08-23 – 2019-08-24 (×2): 1 via ORAL
  Filled 2019-08-22 (×2): qty 1

## 2019-08-22 SURGICAL SUPPLY — 30 items
BENZOIN TINCTURE PRP APPL 2/3 (GAUZE/BANDAGES/DRESSINGS) ×2 IMPLANT
CHLORAPREP W/TINT 26ML (MISCELLANEOUS) ×2 IMPLANT
CLAMP CORD UMBIL (MISCELLANEOUS) ×2 IMPLANT
CLOTH BEACON ORANGE TIMEOUT ST (SAFETY) ×2 IMPLANT
DRSG OPSITE POSTOP 4X10 (GAUZE/BANDAGES/DRESSINGS) ×2 IMPLANT
ELECT REM PT RETURN 9FT ADLT (ELECTROSURGICAL) ×2
ELECTRODE REM PT RTRN 9FT ADLT (ELECTROSURGICAL) ×1 IMPLANT
GAUZE SPONGE 4X4 12PLY STRL LF (GAUZE/BANDAGES/DRESSINGS) ×4 IMPLANT
GLOVE BIO SURGEON STRL SZ7.5 (GLOVE) ×2 IMPLANT
GLOVE BIOGEL PI IND STRL 7.0 (GLOVE) ×1 IMPLANT
GLOVE BIOGEL PI INDICATOR 7.0 (GLOVE) ×1
GOWN STRL REUS W/TWL LRG LVL3 (GOWN DISPOSABLE) ×4 IMPLANT
NEEDLE HYPO 22GX1.5 SAFETY (NEEDLE) ×2 IMPLANT
NEEDLE HYPO 25X5/8 SAFETYGLIDE (NEEDLE) ×2 IMPLANT
NS IRRIG 1000ML POUR BTL (IV SOLUTION) ×2 IMPLANT
PACK C SECTION WH (CUSTOM PROCEDURE TRAY) ×2 IMPLANT
PAD ABD 8X7 1/2 STERILE (GAUZE/BANDAGES/DRESSINGS) ×2 IMPLANT
PENCIL SMOKE EVAC W/HOLSTER (ELECTROSURGICAL) ×2 IMPLANT
STRIP CLOSURE SKIN 1/2X4 (GAUZE/BANDAGES/DRESSINGS) ×2 IMPLANT
SUT MNCRL 0 VIOLET CTX 36 (SUTURE) ×2 IMPLANT
SUT MON AB 2-0 CT1 27 (SUTURE) ×2 IMPLANT
SUT MON AB-0 CT1 36 (SUTURE) ×4 IMPLANT
SUT MONOCRYL 0 CTX 36 (SUTURE) ×2
SUT PLAIN 2 0 XLH (SUTURE) IMPLANT
SUT VIC AB 4-0 KS 27 (SUTURE) ×2 IMPLANT
SYR CONTROL 10ML LL (SYRINGE) ×2 IMPLANT
TAPE CLOTH SURG 4X10 WHT LF (GAUZE/BANDAGES/DRESSINGS) ×2 IMPLANT
TOWEL OR 17X24 6PK STRL BLUE (TOWEL DISPOSABLE) ×2 IMPLANT
TRAY FOLEY W/BAG SLVR 14FR LF (SET/KITS/TRAYS/PACK) ×2 IMPLANT
WATER STERILE IRR 1000ML POUR (IV SOLUTION) ×2 IMPLANT

## 2019-08-22 NOTE — Anesthesia Preprocedure Evaluation (Signed)
Anesthesia Evaluation  Patient identified by MRN, date of birth, ID band Patient awake    Reviewed: Allergy & Precautions, H&P , NPO status , Patient's Chart, lab work & pertinent test results  History of Anesthesia Complications Negative for: history of anesthetic complications  Airway Mallampati: II  TM Distance: >3 FB Neck ROM: full    Dental no notable dental hx.    Pulmonary neg pulmonary ROS,    Pulmonary exam normal        Cardiovascular hypertension, Normal cardiovascular exam     Neuro/Psych  Neuromuscular disease (GBS 2013) negative psych ROS   GI/Hepatic negative GI ROS, Neg liver ROS,   Endo/Other  negative endocrine ROS  Renal/GU negative Renal ROS  negative genitourinary   Musculoskeletal   Abdominal   Peds  Hematology negative hematology ROS (+)   Anesthesia Other Findings   Reproductive/Obstetrics (+) Pregnancy                            Anesthesia Physical Anesthesia Plan  ASA: II  Anesthesia Plan: Spinal   Post-op Pain Management:    Induction:   PONV Risk Score and Plan: Ondansetron and Treatment may vary due to age or medical condition  Airway Management Planned:   Additional Equipment:   Intra-op Plan:   Post-operative Plan:   Informed Consent: I have reviewed the patients History and Physical, chart, labs and discussed the procedure including the risks, benefits and alternatives for the proposed anesthesia with the patient or authorized representative who has indicated his/her understanding and acceptance.       Plan Discussed with:   Anesthesia Plan Comments:         Anesthesia Quick Evaluation

## 2019-08-22 NOTE — Progress Notes (Signed)
Dr Jillyn Hidden called for an additional dose of Zofran

## 2019-08-22 NOTE — Lactation Note (Addendum)
This note was copied from a baby's chart. Lactation Consultation Note  Patient Name: Holly Estrada Today's Date: 08/22/2019 Reason for consult: Initial assessment   P2, 59 4 hours old.  Ex BF 9 mos. Baby STS on mother's chest and cueing. Reviewed hand expression with good flow and then assisted with latching baby in cross cradle hold. Baby opened wide and latched with flanged lips.  Mother pleased. Feed on demand with cues.  Goal 8-12+ times per day after first 24 hrs.  Place baby STS if not cueing.  Placed pillows under mother's arms to support her. Mom made aware of O/P services, breastfeeding support groups, community resources, and our phone # for post-discharge questions.     Maternal Data Has patient been taught Hand Expression?: Yes Does the patient have breastfeeding experience prior to this delivery?: Yes  Feeding Feeding Type: Breast Fed  LATCH Score Latch: Grasps breast easily, tongue down, lips flanged, rhythmical sucking.  Audible Swallowing: A few with stimulation  Type of Nipple: Everted at rest and after stimulation  Comfort (Breast/Nipple): Soft / non-tender  Hold (Positioning): Assistance needed to correctly position infant at breast and maintain latch.  LATCH Score: 8  Interventions Interventions: Breast feeding basics reviewed;Assisted with latch;Support pillows;Hand express  Lactation Tools Discussed/Used     Consult Status Consult Status: Follow-up Date: 08/22/19 Follow-up type: In-patient    [name] [facility] 08/22/2019, 7:44 PM

## 2019-08-22 NOTE — Op Note (Signed)
Cesarean Section Procedure Note  Indications: Previous Cesarean Section with reported CPD  Pre-operative Diagnosis: 39 week 0 day pregnancy.  Post-operative Diagnosis: same  Surgeon: Lovenia Kim   Assistants: Claudette Laws, CNM  Anesthesia: Local anesthesia 0.25.% bupivacaine and Spinal anesthesia  ASA Class: 2  Procedure Details  The patient was seen in the Holding Room. The risks, benefits, complications, treatment options, and expected outcomes were discussed with the patient.  The patient concurred with the proposed plan, giving informed consent. The risks of anesthesia, infection, bleeding and possible injury to other organs discussed. Injury to bowel, bladder, or ureter with possible need for repair discussed. Possible need for transfusion with secondary risks of hepatitis or HIV acquisition discussed. Post operative complications to include but not limited to DVT, PE and Pneumonia noted. The site of surgery properly noted/marked. The patient was taken to Operating Room # A, identified as Gomez Cleverly and the procedure verified as C-Section Delivery. A Time Out was held and the above information confirmed.  After induction of anesthesia, the patient was draped and prepped in the usual sterile manner. A Pfannenstiel incision was made and carried down through the subcutaneous tissue to the fascia. Fascial incision was made and extended transversely using Mayo scissors. The fascia was separated from the underlying rectus tissue superiorly and inferiorly. The peritoneum was identified and entered. Peritoneal incision was extended longitudinally. The utero-vesical peritoneal reflection was incised transversely and the bladder flap was bluntly freed from the lower uterine segment. A low transverse uterine incision(Kerr hysterotomy) was made. Delivered from OT presentation was a  female with Apgar scores of 9 at one minute and 9 at five minutes. Bulb suctioning gently performed. Neonatal team in  attendance.After the umbilical cord was clamped and cut cord blood was obtained for evaluation. The placenta was removed intact and appeared normal. The uterus was curetted with a dry lap pack. Good hemostasis was noted.The uterine outline, tubes and ovaries appeared normal. The uterine incision was closed with running locked sutures of 0 Monocryl x 2 layers. Hemostasis was observed. Lavage was carried out until clear.The parietal peritoneum was closed with a running 2-0 Monocryl suture. The fascia was then reapproximated with running sutures of 0 Monocryl. The skin was reapproximated with 4-vicryl after Wharton closure with 2-0 plain.Instrument, sponge, and needle counts were correct prior the abdominal closure and at the conclusion of the case.   Findings: FTLF, OT, posterior placenta  Estimated Blood Loss:  200 mL         Drains: foley                 Specimens: placenta                 Complications:  None; patient tolerated the procedure well.         Disposition: PACU - hemodynamically stable.         Condition: stable  Attending Attestation: I performed the procedure.

## 2019-08-22 NOTE — Anesthesia Procedure Notes (Signed)
Spinal  Patient location during procedure: OR Staffing Performed: anesthesiologist  Anesthesiologist: Jaquel Glassburn E, MD Preanesthetic Checklist Completed: patient identified, IV checked, risks and benefits discussed, surgical consent, monitors and equipment checked, pre-op evaluation and timeout performed Spinal Block Patient position: sitting Prep: DuraPrep and site prepped and draped Patient monitoring: continuous pulse ox, blood pressure and heart rate Approach: midline Location: L3-4 Injection technique: single-shot Needle Needle type: Pencan  Needle gauge: 24 G Needle length: 9 cm Additional Notes Functioning IV was confirmed and monitors were applied. Sterile prep and drape, including hand hygiene and sterile gloves were used. The patient was positioned and the spine was prepped. The skin was anesthetized with lidocaine.  Free flow of clear CSF was obtained prior to injecting local anesthetic into the CSF. The needle was carefully withdrawn. The patient tolerated the procedure well.      

## 2019-08-22 NOTE — Transfer of Care (Signed)
Immediate Anesthesia Transfer of Care Note  Patient: Holly Estrada  Procedure(s) Performed: Repeat CESAREAN SECTION (N/A Abdomen)  Patient Location: PACU  Anesthesia Type:Spinal  Level of Consciousness: awake, alert  and patient cooperative  Airway & Oxygen Therapy: Patient Spontanous Breathing  Post-op Assessment: Report given to RN and Post -op Vital signs reviewed and stable  Post vital signs: Reviewed and stable  Last Vitals:  Vitals Value Taken Time  BP 119/73 08/22/19 1615  Temp    Pulse 81 08/22/19 1617  Resp 15 08/22/19 1617  SpO2 99 % 08/22/19 1617  Vitals shown include unvalidated device data.  Last Pain:  Vitals:   08/22/19 1343  TempSrc: Oral         Complications: No apparent anesthesia complications

## 2019-08-22 NOTE — H&P (Signed)
Holly Estrada is a 28 y.o. female presenting for rpt csection. OB History    Gravida  2   Para  1   Term  1   Preterm  0   AB  0   Living  0     SAB  0   TAB  0   Ectopic  0   Multiple  0   Live Births             Past Medical History:  Diagnosis Date  . Anemia   . Complication of anesthesia   . Guillain Barr syndrome ([place]) 2013  . Hypertension    Past Surgical History:  Procedure Laterality Date  . CESAREAN SECTION N/A 12/15/2015   Procedure: CESAREAN SECTION;  Surgeon: Boykin Nearing, MD;  Location: [facility] ORS;  Service: Obstetrics;  Laterality: N/A;  . NO PAST SURGERIES     Family History: family history includes Kidney failure in her maternal grandfather. Social History:  reports that she has never smoked. She has never used smokeless tobacco. She reports that she does not drink alcohol or use drugs.     Maternal Diabetes: No Genetic Screening: Normal Maternal Ultrasounds/Referrals: Normal Fetal Ultrasounds or other Referrals:  None Maternal Substance Abuse:  No Significant Maternal Medications:  None Significant Maternal Lab Results:  Group B Strep negative Other Comments:  None  Review of Systems  Constitutional: Negative.   All other systems reviewed and are negative.  Maternal Medical History:  Contractions: Onset was less than 1 hour ago.   Frequency: rare.   Perceived severity is mild.    Fetal activity: Perceived fetal activity is normal.   Last perceived fetal movement was within the past hour.    Prenatal complications: PIH.   Prenatal Complications - Diabetes: none.      unknown if currently breastfeeding. Maternal Exam:  Uterine Assessment: Contraction strength is mild.  Contraction frequency is irregular.   Abdomen: Patient reports no abdominal tenderness. Surgical scars: low transverse.   Fetal presentation: vertex  Introitus: Normal vulva. Normal vagina.  Ferning test: not done.  Nitrazine test: not  done. Amniotic fluid character: not assessed.  Pelvis: questionable for delivery.   Cervix: Cervix evaluated by digital exam.     Physical Exam  Nursing note and vitals reviewed. Constitutional: She is oriented to person, place, and time. She appears well-developed and well-nourished.  HENT:  Head: Normocephalic and atraumatic.  Cardiovascular: Normal rate and regular rhythm.  Respiratory: Effort normal and breath sounds normal.  GI: Soft. Bowel sounds are normal.  Genitourinary:    Vulva, vagina and uterus normal.   Musculoskeletal:        General: Normal range of motion.     Cervical back: Normal range of motion and neck supple.  Neurological: She is alert and oriented to person, place, and time. She has normal reflexes.  Skin: Skin is warm and dry.  Psychiatric: She has a normal mood and affect.    Prenatal labs: ABO, Rh: --/--/A POS, A POS Performed at [facility], [place] [address], [place], [place] [address]  [phone]) Antibody: NEG (12/27 0902) Rubella: Immune (06/08 0000) RPR: NON REACTIVE (12/27 0902)  HBsAg: Negative (06/08 0000)  HIV: Non-reactive (06/08 0000)  GBS:   neg  Assessment/Plan: 39 wk IUP CHTN  Previous csection for rpt.  Surgical risks discussed. Consent done.   Vashawn Ekstein J 08/22/2019, 11:46 AM

## 2019-08-22 NOTE — Progress Notes (Signed)
Orthostatics attempted.  Mom got nauseated and light headed when sitting @ bedside.  Standing blood pressure not gotten

## 2019-08-23 ENCOUNTER — Encounter (HOSPITAL_COMMUNITY): Payer: Self-pay | Admitting: Obstetrics and Gynecology

## 2019-08-23 LAB — CBC
HCT: 27.7 % — ABNORMAL LOW (ref 36.0–46.0)
Hemoglobin: 9.6 g/dL — ABNORMAL LOW (ref 12.0–15.0)
MCH: 31.9 pg (ref 26.0–34.0)
MCHC: 34.7 g/dL (ref 30.0–36.0)
MCV: 92 fL (ref 80.0–100.0)
Platelets: 125 10*3/uL — ABNORMAL LOW (ref 150–400)
RBC: 3.01 MIL/uL — ABNORMAL LOW (ref 3.87–5.11)
RDW: 12.9 % (ref 11.5–15.5)
WBC: 14.9 10*3/uL — ABNORMAL HIGH (ref 4.0–10.5)
nRBC: 0 % (ref 0.0–0.2)

## 2019-08-23 MED ORDER — OXYCODONE HCL 5 MG PO TABS: 5.0000 mg | ORAL_TABLET | ORAL | Status: DC | PRN

## 2019-08-23 MED ORDER — POLYSACCHARIDE IRON COMPLEX 150 MG PO CAPS
150.0000 mg | ORAL_CAPSULE | Freq: Every day | ORAL | Status: DC
  Administered 2019-08-24: 150 mg via ORAL
  Filled 2019-08-23: qty 1

## 2019-08-23 MED ORDER — ACETAMINOPHEN 500 MG PO TABS
1000.0000 mg | ORAL_TABLET | Freq: Four times a day (QID) | ORAL | Status: DC | PRN
  Administered 2019-08-24: 1000 mg via ORAL
  Filled 2019-08-23: qty 2

## 2019-08-23 MED ORDER — MAGNESIUM OXIDE 400 (241.3 MG) MG PO TABS
400.0000 mg | ORAL_TABLET | Freq: Every day | ORAL | Status: DC
  Administered 2019-08-24: 400 mg via ORAL
  Filled 2019-08-23: qty 1

## 2019-08-23 NOTE — Progress Notes (Addendum)
POSTOPERATIVE DAY # 1 S/P Repeat LTCS, baby girl "Caylee"   S:         Reports feeling much better today              Tolerating po intake /  Nausea last night, but much improved this morning / no vomiting / no flatus / no BM  Mild dizziness with standing, but is improving   Denies SOB, or CP             Bleeding is moderate             Pain controlled with Toradol             Up with assistance/ foley catheter in place   Newborn breast feeding - going well    O:  VS: BP 119/76 (BP Location: Right Arm)   Pulse 76   Temp (!) 97.5 F (36.4 C) (Axillary)   Resp 18   Ht 5\' 5"  (1.651 m)   LMP 11/22/2018   SpO2 100%   Breastfeeding Yes   BMI 33.12 kg/m  08/23/19 0325  97.5 F (36.4 C)Abnormal   76  --  18  119/76  Sitting  100 %  --  --  --  -- SS   08/22/19 2238  --  --  --  --  --  --  99 %  --  --  --  -- [facility]   08/22/19 2215  98 F (36.7 C)  --  --  --  --  --  98 %  --  --  --  -- [facility]   08/22/19 1951  97.4 F (36.3 C)Abnormal   65  --  18  119/77  Semi-fowlers  98 %  --  --  --  -- MH   08/22/19 1834  97.7 F (36.5 C)  61  --  18  115/81  Semi-fowlers  98 %  --  --  --  -- MH   08/22/19 1740  96.6 F (35.9 C)Abnormal   75  --  --  129/85  Lying  98 %  Room Air  --  --  -- SB   08/22/19 1730  --  78  77  16  118/81  --  100 %  --  --  --  -- SB   08/22/19 1700  --  66  66  13  114/75  Semi-fowlers  99 %  Room Air  --  --  -- SB   08/22/19 1645  97.8 F (36.6 C)  70  71  13  117/78  Semi-fowlers  100 %  Room Air  --  --  -- SB   08/22/19 1630  --  79  79  15  120/75  Lying  100 %  Room Air  --  --  -- SB   08/22/19 1615  97.6 F (36.4 C)  76  78  14  119/73  Lying  98 %  Room Air  --  --  -- SB   08/22/19 1343  97.9 F (36.6 C)  84  --  18  131/75  --  97 %  Room Air  --  --  -- ED        LABS:               Recent Labs    08/20/19 0902 08/23/19 0627  WBC 10.3 14.9*  HGB 11.5* 9.6*  PLT 144* 125*  Bloodtype: --/--/A POS, A POS Performed at [facility], [place] [address] [place], [place] [address]  [phone])  Rubella: Immune (06/08 0000)                                             I&O: Intake/Output      12/29 0701 - 12/30 0700 12/30 0701 - 12/31 0700   P.O. 250    I.V. 2352.9    Total Intake 2602.9    Urine 1225 175   Blood 415    Total Output 1640 175   Net +962.9 -175                     Physical Exam:             Alert and Oriented X3  Lungs: Clear and unlabored  Heart: regular rate and rhythm / (+) murmur  Abdomen: soft, non-tender, mild gaseous distention              Fundus: firm, non-tender, U-1             Dressing: pressure dressing c/d/i              Incision:  approximated with sutures / no erythema / no ecchymosis / no drainage  Perineum: intact  Lochia: small rubra on pad   Extremities: +1 LE edema, no calf pain or tenderness,   A/P:     POD # 1 S/P Primary LTCS             ABL Anemia compounding chronic IDA   - Niferex 150mg  PO daily tomorrow   - magnesium oxide 400mg  PO daily  Gestational thrombocytopenia on admission - stable  Hx. Of CHTN   - stable, off meds   - Normotensive throughout pregnancy   Routine postoperative care              Tylenol 1000mg  PO q6hrs PRN  Change Percocet to Oxycodone IR 5-10mg  every 4hrs PRN for pain   See lactation today  May shower later today   Desires early d/c home tomorrow   Lars Pinks, MSN, CNM [facility]

## 2019-08-23 NOTE — Anesthesia Postprocedure Evaluation (Signed)
Anesthesia Post Note  Patient: Engineer, manufacturing systems  Procedure(s) Performed: Repeat CESAREAN SECTION (N/A Abdomen)     Patient location during evaluation: PACU Anesthesia Type: Spinal Level of consciousness: oriented and awake and alert Pain management: pain level controlled Vital Signs Assessment: post-procedure vital signs reviewed and stable Respiratory status: spontaneous breathing, respiratory function stable and nonlabored ventilation Cardiovascular status: blood pressure returned to baseline and stable Postop Assessment: no headache, no backache, no apparent nausea or vomiting and spinal receding Anesthetic complications: no    Last Vitals:  Vitals:   08/22/19 2238 08/23/19 0325  BP:  119/76  Pulse:  76  Resp:  18  Temp:  (!) 36.4 C  SpO2: 99% 100%    Last Pain:  Vitals:   08/23/19 0745  TempSrc:   PainSc: 0-No pain   Pain Goal:                Epidural/Spinal Function Cutaneous sensation: Normal sensation (08/23/19 0745), Patient able to flex knees: Yes (08/23/19 0745), Patient able to lift hips off bed: Yes (08/23/19 0745), Back pain beyond tenderness at insertion site: No (08/23/19 0745), Progressively worsening motor and/or sensory loss: No (08/23/19 0745)  Lidia Collum

## 2019-08-24 ENCOUNTER — Encounter (HOSPITAL_COMMUNITY): Payer: Self-pay | Admitting: Lactation Services

## 2019-08-24 MED ORDER — SENNOSIDES-DOCUSATE SODIUM 8.6-50 MG PO TABS: 2.0000 | ORAL_TABLET | Freq: Every evening | ORAL | 0 refills | Status: DC | PRN

## 2019-08-24 MED ORDER — IBUPROFEN 800 MG PO TABS: 800.0000 mg | ORAL_TABLET | Freq: Four times a day (QID) | ORAL | 0 refills | Status: DC

## 2019-08-24 MED ORDER — OXYCODONE HCL 5 MG PO TABS: 5.0000 mg | ORAL_TABLET | ORAL | 0 refills | Status: DC | PRN

## 2019-08-24 MED ORDER — ACETAMINOPHEN 500 MG PO TABS: 1000.0000 mg | ORAL_TABLET | Freq: Four times a day (QID) | ORAL | 0 refills | Status: DC | PRN

## 2019-08-24 NOTE — Addendum Note (Signed)
Addendum  created 08/24/19 3785 by Lidia Collum, MD   Intraprocedure Staff edited

## 2019-08-24 NOTE — Progress Notes (Addendum)
POSTOPERATIVE DAY # 2 S/P Repeat LTCS, baby girl "Caylee"   S:         Reports feeling well, sore, but tolerating. Requesting early d/c home today.              Tolerating po intake / no nausea / no vomiting / + flatus / no BM  Denies dizziness, SOB, or CP             Bleeding is light             Pain controlled with Motrin and Tylenol, but considering taking Oxycodone IR, requesting a few to go home with in case she needs it              Up ad lib / ambulatory/ voiding QS without difficulty   Newborn breast feeding - going well    O:  VS: BP 114/66 (BP Location: Left Arm)   Pulse 81   Temp 99.2 F (37.3 C) (Oral)   Resp 18   Ht 5\' 5"  (1.651 m)   LMP 11/22/2018   SpO2 99%   Breastfeeding Unknown   BMI 33.12 kg/m    LABS:               Recent Labs    08/23/19 0627  WBC 14.9*  HGB 9.6*  PLT 125*               Bloodtype: --/--/A POS, A POS Performed at [facility], [address], [place], [place]  [phone] (94/76)  Rubella: Immune (06/08 0000)                                             I&O: Intake/Output      12/31 0701 - 01/01 0700   Urine    Total Output    Net                     Physical Exam:             Alert and Oriented X3  Lungs: Clear and unlabored  Heart: regular rate and rhythm / (+) murmur  Abdomen: soft, non-tender, mild gaseous distention, active bowel sounds in all quadrants             Fundus: firm, non-tender, U-2             Dressing: honeycomb with steri strips c/di               Incision:  approximated with sutures / no erythema / no ecchymosis / no drainage  Perineum: intact  Lochia: small rubra on pad   Extremities: +1 LE edema, no calf pain or tenderness,   A/P:     POD # 2 S/P Primary LTCS             ABL Anemia compounding chronic IDA                         - stable on oral FE             Gestational thrombocytopenia on admission - stable             Hx. Of CHTN                         -  stable, off meds                 - Normotensive throughout pregnancy   Discharge home  WOB discharge book given, instructions and warning s/s reviewed   F/u in 6 weeks for PP visit  Lars Pinks, MSN, CNM [facility]

## 2019-08-24 NOTE — Lactation Note (Signed)
This note was copied from a baby's chart. Lactation Consultation Note  Patient Name: Holly Estrada Today's Date: 08/24/2019 Reason for consult: Follow-up assessment  P2 mother whose infant is now 4 hours old.  Mother had no questions/concerns related to breast feeding.  She did have a couple questions related to pumping which I answered to her satisfaction.  She will be picking up a DEBP from the [place] office but did not have any pump parts.  Pump kit with instructions provided.  Mother has used this pump in the past.  Engorgement prevention/treatment discussed.  Mother has also experienced engorgement in the past.  She has a manual pump for home use.  Mother has our OP phone number for any further concerns after discharge.  Father present.   Maternal Data    Feeding Feeding Type: Breast Fed  LATCH Score Latch: Grasps breast easily, tongue down, lips flanged, rhythmical sucking.(encouraged depth on breast and supporting baby close)                 Interventions    Lactation Tools Discussed/Used     Consult Status Consult Status: Complete Date: 08/24/19 Follow-up type: Call as needed    Johntay Doolen R Dondi Aime 08/24/2019, 11:42 AM

## 2019-08-24 NOTE — Discharge Summary (Signed)
Obstetric Discharge Summary   Patient Name: Holly Estrada DOB: 1991-05-08 MRN: [id number]  Date of Admission: 08/22/2019 Date of Discharge: 08/24/2019 Date of Delivery: 08/22/2019 Gestational Age at Delivery: [redacted]w[redacted]d  Primary OB: [facility] - M. Niasia Lanphear, CNM   Antepartum complications:  - Previous c/s  - Maternal anemia  - Hx. Of CHTN not on meds - Hx. Of Guillain Barre syndrome - FOB family hx of congenital heart defect, normal fetal echo Prenatal Labs:  ABO, Rh: --/--/A POS, A POS Performed at [facility] [address] [place], [place] [address]  [phone]) Antibody: NEG (12/27 0902) Rubella: Immune (06/08 0000) RPR: NON REACTIVE (12/27 0902)  HBsAg: Negative (06/08 0000)  HIV: Non-reactive (06/08 0000)  GBS:   neg  Admitting Diagnosis: 39 weeks repeat c/s   Secondary Diagnoses: Patient Active Problem List   Diagnosis Date Noted  . Previous cesarean section 08/22/2019  . Postpartum care following cesarean delivery (12/29) 08/22/2019  . S/P cesarean section: elective repeat, hx of CHTN 08/22/2019  . Previous cesarean delivery affecting pregnancy 08/22/2019  . Palpitations 06/07/2019  . Post-operative state 12/15/2015  . Labor and delivery, indication for care 12/13/2015  . Amniotic fluid leaking 11/10/2015  . Family history of congenital heart defect 06/03/2015    Date of Delivery: 08/22/2019 Delivered By: Dr. Ezekiel Slocumb. Nikiah Goin, CNM assist Delivery Type: repeat cesarean section, low transverse incision Anesthesia: spinal   Newborn Data: Live born female  Birth Weight: 6 lb 7.2 oz (2926 g) APGAR: 8, 9  Newborn Delivery   Birth date/time: 08/22/2019 15:25:00 Delivery type: C-Section, Low Transverse Trial of labor: No C-section categorization: Repeat         Hospital/Postpartum Course  (Cesarean Section):  PT. Admitted for repeat c/s at 39 weeks. See notes and operative note for details. Patient had an uncomplicated postpartum course.   By time of discharge on POD#2, her pain was controlled on oral pain medications; she had appropriate lochia and was ambulating, voiding without difficulty, tolerating regular diet and passing flatus.   She was deemed stable for discharge to home.     Labs: CBC Latest Ref Rng & Units 08/23/2019 08/20/2019 12/17/2015  WBC 4.0 - 10.5 K/uL 14.9(H) 10.3 17.7(H)  Hemoglobin 12.0 - 15.0 g/dL 9.6(L) 11.5(L) 8.3(L)  Hematocrit 36.0 - 46.0 % 27.7(L) 32.3(L) 24.0(L)  Platelets 150 - 400 K/uL 125(L) 709(G) 283(M)   Conflict (See Lab Report): A POS/A POS Performed at [facility], [place] [address], [place], [place] [address]   Physical exam:  BP 114/66 (BP Location: Left Arm)   Pulse 81   Temp 99.2 F (37.3 C) (Oral)   Resp 18   Ht 5\' 5"  (1.651 m)   LMP 11/22/2018   SpO2 99%   Breastfeeding Yes   BMI 33.12 kg/m  Alert and Oriented X3             Lungs: Clear and unlabored             Heart: regular rate and rhythm / (+) murmur             Abdomen: soft, non-tender, mild gaseous distention, active bowel sounds in all quadrants             Fundus: firm, non-tender, U-2             Dressing: honeycomb with steri strips c/di               Incision:  approximated with sutures /  no erythema / no ecchymosis / no drainage             Perineum: intact             Lochia: small rubra on pad              Extremities: +1 LE edema, no calf pain or tenderness,   Disposition: stable, discharge to home Baby Feeding: breast milk Baby Disposition: home with mom  Contraception: progestin-only pill   Rh Immune globulin given: N/A Rubella vaccine given: N/A Tdap vaccine given in AP or PP setting: UTD Flu vaccine given in AP or PP setting: UTD   Plan:  Dae Monter was discharged to home in good condition. Follow-up appointment at [facility] in 6 weeks.  Discharge Instructions: Per After Visit Summary. Refer to After Visit Summary and [facility] discharge booklet  Activity: Advance  as tolerated. Pelvic rest for 6 weeks.   Diet: Regular, Heart Healthy Discharge Medications: Allergies as of 08/24/2019   No Known Allergies     Medication List    STOP taking these medications   oxyCODONE-acetaminophen 5-325 MG tablet Commonly known as: PERCOCET/ROXICET     TAKE these medications   acetaminophen 500 MG tablet Commonly known as: TYLENOL Take 2 tablets (1,000 mg total) by mouth every 6 (six) hours as needed for moderate pain.   cetirizine 10 MG tablet Commonly known as: ZYRTEC Take 10 mg by mouth daily.   Ferralet 90 90-1 MG Tabs Take 1 tablet by mouth daily in the afternoon.   FISH OIL PO Take 1 tablet by mouth daily in the afternoon.   ibuprofen 800 MG tablet Commonly known as: ADVIL Take 1 tablet (800 mg total) by mouth every 6 (six) hours. What changed:   medication strength  how much to take   oxyCODONE 5 MG immediate release tablet Commonly known as: Oxy IR/ROXICODONE Take 1-2 tablets (5-10 mg total) by mouth every 4 (four) hours as needed for severe pain.   prenatal multivitamin Tabs tablet Take 1 tablet by mouth daily at 12 noon.   senna-docusate 8.6-50 MG tablet Commonly known as: Senokot-S Take 2 tablets by mouth at bedtime as needed for mild constipation.      Outpatient follow up:  Follow-up Information    Karena Addison, CNM. Go in 6 week(s).   Specialty: Obstetrics and Gynecology Why: Postpartum visit  Contact information: [address] [place] [address] [phone]           Signed:  Carlean Jews, MSN, CNM [facility]

## 2020-11-16 ENCOUNTER — Emergency Department (HOSPITAL_BASED_OUTPATIENT_CLINIC_OR_DEPARTMENT_OTHER): Payer: 59

## 2020-11-16 ENCOUNTER — Other Ambulatory Visit: Payer: Self-pay

## 2020-11-16 ENCOUNTER — Encounter (HOSPITAL_BASED_OUTPATIENT_CLINIC_OR_DEPARTMENT_OTHER): Payer: Self-pay

## 2020-11-16 ENCOUNTER — Emergency Department (HOSPITAL_BASED_OUTPATIENT_CLINIC_OR_DEPARTMENT_OTHER)
Admission: EM | Admit: 2020-11-16 | Discharge: 2020-11-16 | Disposition: A | Discharge status: Home / Self Care | Payer: 59 | Attending: Emergency Medicine | Admitting: Emergency Medicine

## 2020-11-16 DIAGNOSIS — R109 Unspecified abdominal pain: Secondary | ICD-10-CM | POA: Diagnosis present

## 2020-11-16 DIAGNOSIS — I10 Essential (primary) hypertension: Secondary | ICD-10-CM | POA: Insufficient documentation

## 2020-11-16 DIAGNOSIS — R1033 Periumbilical pain: Secondary | ICD-10-CM

## 2020-11-16 DIAGNOSIS — N83201 Unspecified ovarian cyst, right side: Secondary | ICD-10-CM | POA: Diagnosis not present

## 2020-11-16 DIAGNOSIS — N83209 Unspecified ovarian cyst, unspecified side: Secondary | ICD-10-CM

## 2020-11-16 DIAGNOSIS — K429 Umbilical hernia without obstruction or gangrene: Secondary | ICD-10-CM | POA: Insufficient documentation

## 2020-11-16 LAB — PREGNANCY, URINE: Preg Test, Ur: NEGATIVE

## 2020-11-16 LAB — CBC WITH DIFFERENTIAL/PLATELET
Abs Immature Granulocytes: 0.02 10*3/uL (ref 0.00–0.07)
Basophils Absolute: 0.1 10*3/uL (ref 0.0–0.1)
Basophils Relative: 1 %
Eosinophils Absolute: 0.2 10*3/uL (ref 0.0–0.5)
Eosinophils Relative: 2 %
HCT: 35.5 % — ABNORMAL LOW (ref 36.0–46.0)
Hemoglobin: 11.9 g/dL — ABNORMAL LOW (ref 12.0–15.0)
Immature Granulocytes: 0 %
Lymphocytes Relative: 21 %
Lymphs Abs: 1.8 10*3/uL (ref 0.7–4.0)
MCH: 29.9 pg (ref 26.0–34.0)
MCHC: 33.5 g/dL (ref 30.0–36.0)
MCV: 89.2 fL (ref 80.0–100.0)
Monocytes Absolute: 0.5 10*3/uL (ref 0.1–1.0)
Monocytes Relative: 6 %
Neutro Abs: 6.3 10*3/uL (ref 1.7–7.7)
Neutrophils Relative %: 70 %
Platelets: 239 10*3/uL (ref 150–400)
RBC: 3.98 MIL/uL (ref 3.87–5.11)
RDW: 12.5 % (ref 11.5–15.5)
WBC: 8.8 10*3/uL (ref 4.0–10.5)
nRBC: 0 % (ref 0.0–0.2)

## 2020-11-16 LAB — BASIC METABOLIC PANEL
Anion gap: 7 (ref 5–15)
BUN: 15 mg/dL (ref 6–20)
CO2: 24 mmol/L (ref 22–32)
Calcium: 9.2 mg/dL (ref 8.9–10.3)
Chloride: 107 mmol/L (ref 98–111)
Creatinine, Ser: 0.75 mg/dL (ref 0.44–1.00)
GFR, Estimated: >60 mL/min (ref >60)
Glucose, Bld: 98 mg/dL (ref 70–99)
Potassium: 3.6 mmol/L (ref 3.5–5.1)
Sodium: 138 mmol/L (ref 135–145)

## 2020-11-16 MED ORDER — IOHEXOL 300 MG/ML  SOLN
100.0000 mL | Freq: Once | INTRAMUSCULAR | Status: AC | PRN
  Administered 2020-11-16: 100 mL via INTRAVENOUS

## 2020-11-16 NOTE — ED Notes (Signed)
Pt back from CT via wheelchair. Pt tolerated well. Pt placed back in room. VS cycling.

## 2020-11-16 NOTE — ED Notes (Signed)
Pt taken to CT via wheelchair. Pt tolerated well. 

## 2020-11-16 NOTE — ED Provider Notes (Signed)
[facility] EMERGENCY DEPT Provider Note   CSN: [id number] Arrival date & time: 11/16/20  1758     History Chief Complaint  Patient presents with  . Abdominal Pain  . Hernia    Holly Estrada is a 30 y.o. female w/ hx of C-section x 2, umbilical hernia, presenting to ED with acute onset abdominal pain.  Pain lasted from 3 pm-6pm today, and resolved spontaneously just before arriving in the ED.  She reports she just rolled over in her bed and began having abdominal pain.  Felt like the "hernia pain" she's had for 1-2 months.  Sharp, stabbing pain near umbilicus, does not radiate, very tender and painful to touch, with nausea.  She had a bowel movement during this.  She's had similar episodes but not lasting this long in the past.    She's seen Dr Derrell Lolling from General Surgery as an outpatient but reports they haven't scheduled a procedure yet for hernia repair.  NKDA No other medical problems  HPI     Past Medical History:  Diagnosis Date  . Anemia   . Complication of anesthesia   . Guillain Barr syndrome (HCC) 2013  . Hypertension     Patient Active Problem List   Diagnosis Date Noted  . Previous cesarean section 08/22/2019  . Postpartum care following cesarean delivery (12/29) 08/22/2019  . S/P cesarean section: elective repeat, hx of CHTN 08/22/2019  . Previous cesarean delivery affecting pregnancy 08/22/2019  . Palpitations 06/07/2019  . Post-operative state 12/15/2015  . Labor and delivery, indication for care 12/13/2015  . Amniotic fluid leaking 11/10/2015  . Family history of congenital heart defect 06/03/2015    Past Surgical History:  Procedure Laterality Date  . CESAREAN SECTION N/A 12/15/2015   Procedure: CESAREAN SECTION;  Surgeon: Suzy Bouchard, MD;  Location: [facility] ORS;  Service: Obstetrics;  Laterality: N/A;  . CESAREAN SECTION N/A 08/22/2019   Procedure: Repeat CESAREAN SECTION;  Surgeon: Olivia Mackie, MD;  Location: [facility] ORS;   Service: Obstetrics;  Laterality: N/A;  EDD: 08/30/19  . NO PAST SURGERIES       OB History    Gravida  2   Para  2   Term  1   Preterm  0   AB  0   Living  1     SAB  0   IAB  0   Ectopic  0   Multiple  1   Live Births  1           Family History  Problem Relation Age of Onset  . Kidney failure Maternal Grandfather     Social History   Tobacco Use  . Smoking status: Never Smoker  . Smokeless tobacco: Never Used  Vaping Use  . Vaping Use: Never used  Substance Use Topics  . Alcohol use: No  . Drug use: No    Home Medications Prior to Admission medications   Medication Sig Start Date End Date Taking? Authorizing Provider  acetaminophen (TYLENOL) 500 MG tablet Take 2 tablets (1,000 mg total) by mouth every 6 (six) hours as needed for moderate pain. 08/24/19   Sigmon, Scarlette Slice, CNM  cetirizine (ZYRTEC) 10 MG tablet Take 10 mg by mouth daily.    [provider]  Fe Cbn-Fe Gluc-FA-B12-C-DSS (FERRALET 90) 90-1 MG TABS Take 1 tablet by mouth daily in the afternoon. 07/27/19   [provider]  ibuprofen (ADVIL) 800 MG tablet Take 1 tablet (800 mg total) by mouth every  6 (six) hours. 08/24/19   Sigmon, Scarlette Slice, CNM  Omega-3 Fatty Acids (FISH OIL PO) Take 1 tablet by mouth daily in the afternoon.    [provider]  oxyCODONE (OXY IR/ROXICODONE) 5 MG immediate release tablet Take 1-2 tablets (5-10 mg total) by mouth every 4 (four) hours as needed for severe pain. 08/24/19   Karena Addison, CNM  Prenatal Vit-Fe Fumarate-FA (PRENATAL MULTIVITAMIN) TABS tablet Take 1 tablet by mouth daily at 12 noon.    [provider]  senna-docusate (SENOKOT-S) 8.6-50 MG tablet Take 2 tablets by mouth at bedtime as needed for mild constipation. 08/24/19   Karena Addison, CNM    Allergies    Patient has no known allergies.  Review of Systems   Review of Systems  Constitutional: Negative for chills and fever.  Eyes: Negative for  pain and visual disturbance.  Respiratory: Negative for cough and shortness of breath.   Cardiovascular: Negative for chest pain and palpitations.  Gastrointestinal: Positive for nausea. Negative for abdominal pain, constipation, diarrhea and vomiting.  Musculoskeletal: Negative for arthralgias and back pain.  Skin: Negative for color change and rash.  Neurological: Negative for syncope and headaches.  All other systems reviewed and are negative.   Physical Exam Updated Vital Signs BP (!) 134/97 (BP Location: Right Arm)   Pulse 67   Temp 98.1 F (36.7 C) (Oral)   Resp 17   Ht 5\' 5"  (1.651 m)   Wt 76.7 kg   LMP 11/02/2020   SpO2 99%   BMI 28.12 kg/m   Physical Exam Constitutional:      General: She is not in acute distress. HENT:     Head: Normocephalic and atraumatic.  Eyes:     Conjunctiva/sclera: Conjunctivae normal.     Pupils: Pupils are equal, round, and reactive to light.  Cardiovascular:     Rate and Rhythm: Normal rate and regular rhythm.     Heart sounds: Normal heart sounds.  Pulmonary:     Effort: Pulmonary effort is normal. No respiratory distress.  Abdominal:     General: There is no distension.     Tenderness: There is abdominal tenderness in the periumbilical area. There is no guarding or rebound.     Comments: Paraumbilical tenderness, no palpable bowel protrusion  Skin:    General: Skin is warm and dry.  Neurological:     General: No focal deficit present.     Mental Status: She is alert. Mental status is at baseline.  Psychiatric:        Mood and Affect: Mood normal.        Behavior: Behavior normal.     ED Results / Procedures / Treatments   Labs (all labs ordered are listed, but only abnormal results are displayed) Labs Reviewed  CBC WITH DIFFERENTIAL/PLATELET - Abnormal; Notable for the following components:      Result Value   Hemoglobin 11.9 (*)    HCT 35.5 (*)    All other components within normal limits  BASIC METABOLIC PANEL   PREGNANCY, URINE    EKG None  Radiology CT ABDOMEN PELVIS W CONTRAST  Result Date: 11/16/2020 CLINICAL DATA:  Abdominal pain.  Umbilical hernia EXAM: CT ABDOMEN AND PELVIS WITH CONTRAST TECHNIQUE: Multidetector CT imaging of the abdomen and pelvis was performed using the standard protocol following bolus administration of intravenous contrast. CONTRAST:  11/18/2020 OMNIPAQUE IOHEXOL 300 MG/ML  SOLN COMPARISON:  None. FINDINGS: Lower chest: Included lung bases are clear.  Heart size  is normal. Hepatobiliary: No focal liver abnormality is seen. No gallstones, gallbladder wall thickening, or biliary dilatation. Pancreas: Unremarkable. No pancreatic ductal dilatation or surrounding inflammatory changes. Spleen: Normal in size without focal abnormality. Adrenals/Urinary Tract: Unremarkable adrenal glands. Kidneys enhance symmetrically without focal lesion, stone, or hydronephrosis. Ureters are nondilated. Urinary bladder appears unremarkable. Stomach/Bowel: Stomach is within normal limits. Appendix appears normal (series 2, image 51). Small bowel within normal limits. Moderate volume of stool throughout the colon. Colon is redundant. No evidence of bowel wall thickening, distention, or inflammatory changes. Vascular/Lymphatic: No significant vascular findings are present. No enlarged abdominal or pelvic lymph nodes. Reproductive: Uterus within normal limits. Involuting or recently ruptured small right ovarian cyst (series 5, image 37). Left adnexal region within normal limits. Other: Trace free fluid within the pelvis, likely physiologic or related to cyst rupture. No organized abdominopelvic fluid collection. No pneumoperitoneum. Small fat containing umbilical hernia. Hernia neck measures approximately 6 mm. Small amount of fluid within the hernia sac. Musculoskeletal: No acute or significant osseous findings. IMPRESSION: 1. Small fat-containing umbilical hernia with a small amount of fluid within the hernia sac.  2. Involuting or recently ruptured small right ovarian cyst. Trace free fluid within the pelvis, likely physiologic or related to cyst rupture. 3. Moderate volume of stool throughout the colon. Electronically Signed   By: Duanne Guess D.O.   On: 11/16/2020 20:18    Procedures Procedures   Medications Ordered in ED Medications  iohexol (OMNIPAQUE) 300 MG/ML solution 100 mL (100 mLs Intravenous Contrast Given 11/16/20 1957)    ED Course  I have reviewed the triage vital signs and the nursing notes.  Pertinent labs & imaging results that were available during my care of the patient were reviewed by me and considered in my medical decision making (see chart for details).  30 year old female presenting to ED with acute onset paraumbilical pain  Most likely related to an umbilical hernia.  ddx also includes kidney stone vs colitis vs other  The hernia appears to have spontaneously reduced prior to arrival as she has had dramatic reduction in pain.  She still exhibits some tenderness on exam which may be residual muscular cramping or pain.  No overlying skin changes or firm bulging hernia on my exam.  Discussed CT imaging to evaluate bowel and size of fascial defect.  If no evidence of persistent strangulation or injury, and her pain remains negligible, I will message Dr Derrell Lolling about this visit to see if they can expedite surgical evaluation.    *  Labs reviewed - unremarkable   Clinical Course as of 11/16/20 2103  Sat Nov 16, 2020  1939 Reassess pt - pain remains very minimal, she feels well, watching television [MT]  2022  IMPRESSION: 1. Small fat-containing umbilical hernia with a small amount of fluid within the hernia sac. 2. Involuting or recently ruptured small right ovarian cyst. Trace free fluid within the pelvis, likely physiologic or related to cyst rupture. 3. Moderate volume of stool throughout the colon.  [MT]  2022 Still pain free, will d/c with general surgery  follow up [MT]    Clinical Course User Index [MT] Sabiha Sura, Kermit Balo, MD    Final Clinical Impression(s) / ED Diagnoses Final diagnoses:  Umbilical pain  Cyst of ovary, unspecified laterality    Rx / DC Orders ED Discharge Orders    None       Terald Sleeper, MD 11/16/20 2104

## 2020-11-16 NOTE — ED Triage Notes (Signed)
Pt c/o umbilical hernia & abd pain started 2 hours PTA. States abd distended earlier. C/o pain in lower back; denies dysuria. States pain accompanied w/ BM.

## 2021-01-06 LAB — HM PAP SMEAR: HM Pap smear: NEGATIVE

## 2021-01-17 ENCOUNTER — Other Ambulatory Visit: Payer: Self-pay | Admitting: Obstetrics and Gynecology

## 2021-02-10 ENCOUNTER — Other Ambulatory Visit (HOSPITAL_COMMUNITY)
Admission: RE | Admit: 2021-02-10 | Discharge: 2021-02-10 | Disposition: A | Discharge status: Home / Self Care | Payer: 59 | Source: Ambulatory Visit | Attending: General Surgery | Admitting: General Surgery

## 2021-02-10 ENCOUNTER — Other Ambulatory Visit: Payer: Self-pay

## 2021-02-10 ENCOUNTER — Other Ambulatory Visit (HOSPITAL_COMMUNITY): Payer: 59

## 2021-02-10 ENCOUNTER — Encounter (HOSPITAL_BASED_OUTPATIENT_CLINIC_OR_DEPARTMENT_OTHER): Payer: Self-pay | Admitting: General Surgery

## 2021-02-10 DIAGNOSIS — Z01812 Encounter for preprocedural laboratory examination: Secondary | ICD-10-CM | POA: Insufficient documentation

## 2021-02-10 DIAGNOSIS — Z20822 Contact with and (suspected) exposure to covid-19: Secondary | ICD-10-CM | POA: Insufficient documentation

## 2021-02-10 LAB — SARS CORONAVIRUS 2 (TAT 6-24 HRS): SARS Coronavirus 2: NEGATIVE

## 2021-02-10 NOTE — Progress Notes (Signed)
Spoke w/ via phone for pre-op interview--- Pt Lab needs dos----  per Dr Billy Coast, CBC, Serum preg, t&s/  dr Derrell Lolling orders pending             Lab results------ no COVID test -----patient states asymptomatic no test needed Arrive at ------- 0600 on 02-13-21 NPO after MN NO Solid Food.  Clear liquids from MN until--- 0500 Med rec completed Medications to take morning of surgery ----- NONE Diabetic medication ----- n/a Patient instructed no nail polish to be worn day of surgery Patient instructed to bring photo id and insurance card day of surgery Patient aware to have Driver (ride ) / caregiver for 24 hours after surgery -- husband, Holly Estrada Patient Special Instructions ----- sent inbox message to dr Derrell Lolling requested orders Pre-Op special Istructions ----- n/a Patient verbalized understanding of instructions that were given at this phone interview. Patient denies shortness of breath, chest pain, fever, cough at this phone interview.

## 2021-02-12 ENCOUNTER — Encounter (HOSPITAL_BASED_OUTPATIENT_CLINIC_OR_DEPARTMENT_OTHER): Payer: Self-pay | Admitting: General Surgery

## 2021-02-12 ENCOUNTER — Ambulatory Visit: Payer: Self-pay | Admitting: General Surgery

## 2021-02-12 NOTE — Anesthesia Preprocedure Evaluation (Addendum)
Anesthesia Evaluation  Patient identified by MRN, date of birth, ID band Patient awake    Reviewed: Allergy & Precautions, NPO status , Patient's Chart, lab work & pertinent test results, reviewed documented beta blocker date and time   History of Anesthesia Complications (+) PONV and history of anesthetic complications  Airway Mallampati: III  TM Distance: >3 FB Neck ROM: Full    Dental  (+) Poor Dentition, Dental Advisory Given   Pulmonary neg pulmonary ROS,    Pulmonary exam normal breath sounds clear to auscultation       Cardiovascular negative cardio ROS Normal cardiovascular exam Rhythm:Regular Rate:Normal     Neuro/Psych Hx/o Guillian Barre syndrome 2013- fully recovered negative neurological ROS  negative psych ROS   GI/Hepatic negative GI ROS, Neg liver ROS,   Endo/Other  negative endocrine ROS  Renal/GU negative Renal ROS  negative genitourinary   Musculoskeletal negative musculoskeletal ROS (+) Umbilical hernia   Abdominal   Peds  Hematology  (+) anemia ,   Anesthesia Other Findings   Reproductive/Obstetrics Desires permanent sterilization                            Anesthesia Physical Anesthesia Plan  ASA: 2  Anesthesia Plan: General   Post-op Pain Management:    Induction: Intravenous  PONV Risk Score and Plan: 4 or greater and Scopolamine patch - Pre-op, Treatment may vary due to age or medical condition, Midazolam, Dexamethasone, Ondansetron and Aprepitant  Airway Management Planned: Oral ETT  Additional Equipment:   Intra-op Plan:   Post-operative Plan: Extubation in OR  Informed Consent: I have reviewed the patients History and Physical, chart, labs and discussed the procedure including the risks, benefits and alternatives for the proposed anesthesia with the patient or authorized representative who has indicated his/her understanding and acceptance.      Dental advisory given  Plan Discussed with: CRNA and Anesthesiologist  Anesthesia Plan Comments:        Anesthesia Quick Evaluation

## 2021-02-12 NOTE — H&P (Signed)
History of Present Illness Axel Filler MD; 09/20/2020 11:04 AM) The patient is a 30 year old female who presents with an umbilical hernia. Referred by: Dr. Rosemary Holms Chief Complaint: Umbilical hernia  Patient is a 30 year old female, who comes in with an umbilical hernia. Patient states she's had an umbilical hernia with her first pregnancy for years ago.  She states that she also had a return of the hernia With her second child.  Patient is currently 13 months postpartum. Patient states that she had some recent pain at her umbilicus.  She feels that she is try steroid for many core activity to help with any discomfort.  Patient feels that is not this early gotten larger.  She's had no signs or symptoms of incarceration or strangulation.  Patient is wanting to coordinate tubal ligation with the hernia repair.  I do feel that this is appropriate.        Past Surgical History Rosezella Florida, RN; 09/20/2020 10:45 AM) Cesarean Section - Multiple    Diagnostic Studies History Rosezella Florida, RN; 09/20/2020 10:45 AM) Colonoscopy   never Mammogram   never Pap Smear   1-5 years ago  Allergies Rosezella Florida, RN; 09/20/2020 10:46 AM) No Known Drug Allergies   [09/20/2020]: Allergies Reconciled    Medication History (Diane Herrin, RN; 09/20/2020 10:47 AM) ZyrTEC Allergy  (10MG  Capsule, Oral) Active. Prenatal Vitamins  (0.8MG  Tablet, Oral) Active. Medications Reconciled   Social History , RN; 09/20/2020 10:45 AM) No alcohol use   No caffeine use   No drug use   Tobacco use   Never smoker.  Family History 09/22/2020, RN; 09/20/2020 10:45 AM) Colon Polyps   Father. Hypertension   Mother.  Pregnancy / Birth History 09/22/2020, RN; 09/20/2020 10:45 AM) Age at menarche   12 years. Gravida   2 Length (months) of breastfeeding   12-24 Maternal age   48-25 Para   2 Regular periods    Other Problems 23-34, RN; 09/20/2020 10:45 AM) Heart murmur      Review of  Systems 09/22/2020 MD; 09/20/2020 11:02 AM) General Not Present- Appetite Loss, Chills, Fatigue, Fever, Night Sweats, Weight Gain and Weight Loss. Skin Present- Change in Wart/Mole. Not Present- Dryness, Hives, Jaundice, New Lesions, Non-Healing Wounds, Rash and Ulcer. HEENT Not Present- Earache, Hearing Loss, Hoarseness, Nose Bleed, Oral Ulcers, Ringing in the Ears, Seasonal Allergies, Sinus Pain, Sore Throat, Visual Disturbances, Wears glasses/contact lenses and Yellow Eyes. Respiratory Not Present- Bloody sputum, Chronic Cough, Difficulty Breathing, Snoring and Wheezing. Breast Not Present- Breast Mass, Breast Pain, Nipple Discharge and Skin Changes. Cardiovascular Not Present- Chest Pain, Difficulty Breathing Lying Down, Leg Cramps, Palpitations, Rapid Heart Rate, Shortness of Breath and Swelling of Extremities. Gastrointestinal Present- Abdominal Pain. Not Present- Bloating, Bloody Stool, Change in Bowel Habits, Chronic diarrhea, Constipation, Difficulty Swallowing, Excessive gas, Gets full quickly at meals, Hemorrhoids, Indigestion, Nausea, Rectal Pain and Vomiting. Female Genitourinary Not Present- Frequency, Nocturia, Painful Urination, Pelvic Pain and Urgency. Musculoskeletal Not Present- Back Pain, Joint Pain, Joint Stiffness, Muscle Pain, Muscle Weakness and Swelling of Extremities. Neurological Not Present- Decreased Memory, Fainting, Headaches, Numbness, Seizures, Tingling, Tremor, Trouble walking and Weakness. Psychiatric Not Present- Anxiety, Bipolar, Change in Sleep Pattern, Depression, Fearful and Frequent crying. Endocrine Not Present- Cold Intolerance, Excessive Hunger, Hair Changes, Heat Intolerance, Hot flashes and New Diabetes. Hematology Not Present- Blood Thinners, Easy Bruising, Excessive bleeding, Gland problems, HIV and Persistent Infections. All other systems negative  Vitals (Diane Herrin RN; 09/20/2020 10:47 AM) 09/20/2020  10:47 AM Weight: 169.25 lb   Height: 65 in   Body Surface Area: 1.84 m   Body Mass Index: 28.16 kg/m   Temp.: 98.2 F    Pulse: 107 (Regular)    P.OX: 98% (Room air) BP: 128/80(Sitting, Left Arm, Standard)       Physical Exam Axel Filler MD; 09/20/2020 11:04 AM) The physical exam findings are as follows: Note:   Constitutional: No acute distress, conversant, appears stated age  Eyes: Anicteric sclerae, moist conjunctiva, no lid lag  Neck: No thyromegaly, trachea midline, no cervical lymphadenopathy  Lungs: Clear to auscultation biilaterally, normal respiratory effot  Cardiovascular: regular rate & rhythm, no murmurs, no peripheal edema, pedal pulses 2+  GI: Soft, no masses or hepatosplenomegaly, non-tender to palpation  MSK: Normal gait, no clubbing cyanosis, edema  Skin: No rashes, palpation reveals normal skin turgor  Psychiatric: Appropriate judgment and insight, oriented to person, place, and time  Abdomen Inspection Hernias - Umbilical hernia - Reducible.    Assessment & Plan Axel Filler MD; 09/20/2020 11:05 AM) UMBILICAL HERNIA WITHOUT OBSTRUCTION AND WITHOUT GANGRENE (K42.9) Impression: 30 year old female, who comes in with a primary umbilical hernia. Patient would like to proceed to the operating room for laparoscopic umbilical hernia repair with mesh. Patient also would be a candidate for combined tubal ligation with Dr. Rosemary Holms  All risks and benefits were discussed with the patient to generally include, but not limited to: infection, bleeding, damage to surrounding structures, acute and chronic nerve pain, and recurrence. Alternatives were offered and described. All questions were answered and the patient voiced understanding of the procedure and wishes to proceed at this point with hernia repair.

## 2021-02-13 ENCOUNTER — Encounter (HOSPITAL_BASED_OUTPATIENT_CLINIC_OR_DEPARTMENT_OTHER): Payer: Self-pay | Admitting: General Surgery

## 2021-02-13 ENCOUNTER — Ambulatory Visit (HOSPITAL_BASED_OUTPATIENT_CLINIC_OR_DEPARTMENT_OTHER)
Admission: RE | Admit: 2021-02-13 | Discharge: 2021-02-13 | Disposition: A | Discharge status: Home / Self Care | Payer: 59 | Attending: General Surgery | Admitting: General Surgery

## 2021-02-13 ENCOUNTER — Other Ambulatory Visit: Payer: Self-pay

## 2021-02-13 ENCOUNTER — Ambulatory Visit (HOSPITAL_BASED_OUTPATIENT_CLINIC_OR_DEPARTMENT_OTHER): Payer: 59 | Admitting: Anesthesiology

## 2021-02-13 ENCOUNTER — Encounter (HOSPITAL_BASED_OUTPATIENT_CLINIC_OR_DEPARTMENT_OTHER)
Admission: RE | Disposition: A | Discharge status: Home / Self Care | Payer: Self-pay | Source: Home / Self Care | Attending: General Surgery

## 2021-02-13 DIAGNOSIS — Z98891 History of uterine scar from previous surgery: Secondary | ICD-10-CM | POA: Insufficient documentation

## 2021-02-13 DIAGNOSIS — K429 Umbilical hernia without obstruction or gangrene: Secondary | ICD-10-CM | POA: Diagnosis not present

## 2021-02-13 DIAGNOSIS — Z8759 Personal history of other complications of pregnancy, childbirth and the puerperium: Secondary | ICD-10-CM | POA: Insufficient documentation

## 2021-02-13 DIAGNOSIS — Z302 Encounter for sterilization: Secondary | ICD-10-CM | POA: Insufficient documentation

## 2021-02-13 HISTORY — DX: Umbilical hernia without obstruction or gangrene: K42.9

## 2021-02-13 HISTORY — PX: LAPAROSCOPIC TUBAL LIGATION: SHX1937

## 2021-02-13 HISTORY — DX: Nausea with vomiting, unspecified: Z98.890

## 2021-02-13 HISTORY — PX: UMBILICAL HERNIA REPAIR: SHX196

## 2021-02-13 HISTORY — DX: Nausea with vomiting, unspecified: R11.2

## 2021-02-13 LAB — CBC
HCT: 37.2 % (ref 36.0–46.0)
Hemoglobin: 12.4 g/dL (ref 12.0–15.0)
MCH: 30.1 pg (ref 26.0–34.0)
MCHC: 33.3 g/dL (ref 30.0–36.0)
MCV: 90.3 fL (ref 80.0–100.0)
Platelets: 197 10*3/uL (ref 150–400)
RBC: 4.12 MIL/uL (ref 3.87–5.11)
RDW: 12.2 % (ref 11.5–15.5)
WBC: 5.7 10*3/uL (ref 4.0–10.5)
nRBC: 0 % (ref 0.0–0.2)

## 2021-02-13 LAB — TYPE AND SCREEN
ABO/RH(D): A POS
Antibody Screen: NEGATIVE

## 2021-02-13 LAB — HCG, SERUM, QUALITATIVE: Preg, Serum: NEGATIVE

## 2021-02-13 LAB — POCT PREGNANCY, URINE: Preg Test, Ur: NEGATIVE

## 2021-02-13 SURGERY — REPAIR, HERNIA, UMBILICAL, LAPAROSCOPIC
Anesthesia: General | Site: Abdomen

## 2021-02-13 MED ORDER — FENTANYL CITRATE (PF) 100 MCG/2ML IJ SOLN: 25.0000 ug | INTRAMUSCULAR | Status: DC | PRN

## 2021-02-13 MED ORDER — CHLORHEXIDINE GLUCONATE CLOTH 2 % EX PADS: 6.0000 | MEDICATED_PAD | Freq: Once | CUTANEOUS | Status: DC

## 2021-02-13 MED ORDER — SCOPOLAMINE 1 MG/3DAYS TD PT72
1.0000 | MEDICATED_PATCH | TRANSDERMAL | Status: DC
  Administered 2021-02-13: 1.5 mg via TRANSDERMAL

## 2021-02-13 MED ORDER — DIPHENHYDRAMINE HCL 50 MG/ML IJ SOLN
25.0000 mg | Freq: Once | INTRAMUSCULAR | Status: AC
  Administered 2021-02-13: 25 mg via INTRAVENOUS

## 2021-02-13 MED ORDER — MIDAZOLAM HCL 2 MG/2ML IJ SOLN
INTRAMUSCULAR | Status: AC
  Filled 2021-02-13: qty 2

## 2021-02-13 MED ORDER — DEXAMETHASONE SODIUM PHOSPHATE 4 MG/ML IJ SOLN
INTRAMUSCULAR | Status: DC | PRN
  Administered 2021-02-13: 10 mg via INTRAVENOUS

## 2021-02-13 MED ORDER — ONDANSETRON HCL 4 MG/2ML IJ SOLN
INTRAMUSCULAR | Status: DC | PRN
  Administered 2021-02-13: 4 mg via INTRAVENOUS

## 2021-02-13 MED ORDER — KETOROLAC TROMETHAMINE 30 MG/ML IJ SOLN
INTRAMUSCULAR | Status: DC | PRN
  Administered 2021-02-13: 30 mg via INTRAVENOUS

## 2021-02-13 MED ORDER — DEXAMETHASONE SODIUM PHOSPHATE 10 MG/ML IJ SOLN
INTRAMUSCULAR | Status: AC
  Filled 2021-02-13: qty 1

## 2021-02-13 MED ORDER — BUPIVACAINE HCL 0.25 % IJ SOLN
INTRAMUSCULAR | Status: DC | PRN
  Administered 2021-02-13: 6 mL

## 2021-02-13 MED ORDER — DIPHENHYDRAMINE HCL 50 MG/ML IJ SOLN
INTRAMUSCULAR | Status: AC
  Filled 2021-02-13: qty 1

## 2021-02-13 MED ORDER — APREPITANT 40 MG PO CAPS
40.0000 mg | ORAL_CAPSULE | Freq: Once | ORAL | Status: AC
  Administered 2021-02-13: 40 mg via ORAL

## 2021-02-13 MED ORDER — SUGAMMADEX SODIUM 200 MG/2ML IV SOLN
INTRAVENOUS | Status: DC | PRN
  Administered 2021-02-13: 200 mg via INTRAVENOUS

## 2021-02-13 MED ORDER — SCOPOLAMINE 1 MG/3DAYS TD PT72
MEDICATED_PATCH | TRANSDERMAL | Status: AC
  Filled 2021-02-13: qty 1

## 2021-02-13 MED ORDER — PROPOFOL 10 MG/ML IV BOLUS
INTRAVENOUS | Status: AC
  Filled 2021-02-13: qty 40

## 2021-02-13 MED ORDER — CEFAZOLIN SODIUM-DEXTROSE 2-4 GM/100ML-% IV SOLN: 2.0000 g | INTRAVENOUS | Status: DC

## 2021-02-13 MED ORDER — OXYCODONE HCL 5 MG/5ML PO SOLN: 5.0000 mg | Freq: Once | ORAL | Status: AC | PRN

## 2021-02-13 MED ORDER — ONDANSETRON HCL 4 MG/2ML IJ SOLN: 4.0000 mg | Freq: Once | INTRAMUSCULAR | Status: DC | PRN

## 2021-02-13 MED ORDER — PROPOFOL 10 MG/ML IV BOLUS
INTRAVENOUS | Status: DC | PRN
  Administered 2021-02-13: 120 mg via INTRAVENOUS

## 2021-02-13 MED ORDER — CEFAZOLIN SODIUM-DEXTROSE 2-4 GM/100ML-% IV SOLN
INTRAVENOUS | Status: AC
  Filled 2021-02-13: qty 100

## 2021-02-13 MED ORDER — LACTATED RINGERS IV SOLN: INTRAVENOUS | Status: DC

## 2021-02-13 MED ORDER — ONDANSETRON HCL 4 MG/2ML IJ SOLN
INTRAMUSCULAR | Status: AC
  Filled 2021-02-13: qty 2

## 2021-02-13 MED ORDER — ROCURONIUM BROMIDE 100 MG/10ML IV SOLN
INTRAVENOUS | Status: DC | PRN
  Administered 2021-02-13: 60 mg via INTRAVENOUS

## 2021-02-13 MED ORDER — OXYCODONE HCL 5 MG PO TABS
ORAL_TABLET | ORAL | Status: AC
  Filled 2021-02-13: qty 1

## 2021-02-13 MED ORDER — FENTANYL CITRATE (PF) 100 MCG/2ML IJ SOLN
INTRAMUSCULAR | Status: AC
  Filled 2021-02-13: qty 2

## 2021-02-13 MED ORDER — APREPITANT 40 MG PO CAPS
ORAL_CAPSULE | ORAL | Status: AC
  Filled 2021-02-13: qty 1

## 2021-02-13 MED ORDER — ACETAMINOPHEN 500 MG PO TABS
1000.0000 mg | ORAL_TABLET | ORAL | Status: AC
  Administered 2021-02-13: 1000 mg via ORAL

## 2021-02-13 MED ORDER — POVIDONE-IODINE 10 % EX SWAB: 2.0000 "application " | Freq: Once | CUTANEOUS | Status: DC

## 2021-02-13 MED ORDER — TRAMADOL HCL 50 MG PO TABS: 50.0000 mg | ORAL_TABLET | Freq: Four times a day (QID) | ORAL | 0 refills | Status: AC | PRN

## 2021-02-13 MED ORDER — OXYCODONE HCL 5 MG PO TABS
5.0000 mg | ORAL_TABLET | Freq: Once | ORAL | Status: AC | PRN
  Administered 2021-02-13: 5 mg via ORAL

## 2021-02-13 MED ORDER — MIDAZOLAM HCL 5 MG/5ML IJ SOLN
INTRAMUSCULAR | Status: DC | PRN
  Administered 2021-02-13: 2 mg via INTRAVENOUS

## 2021-02-13 MED ORDER — FENTANYL CITRATE (PF) 100 MCG/2ML IJ SOLN
INTRAMUSCULAR | Status: DC | PRN
  Administered 2021-02-13: 50 ug via INTRAVENOUS
  Administered 2021-02-13: 100 ug via INTRAVENOUS
  Administered 2021-02-13: 25 ug via INTRAVENOUS

## 2021-02-13 MED ORDER — ACETAMINOPHEN 500 MG PO TABS
ORAL_TABLET | ORAL | Status: AC
  Filled 2021-02-13: qty 2

## 2021-02-13 MED ORDER — 0.9 % SODIUM CHLORIDE (POUR BTL) OPTIME
TOPICAL | Status: DC | PRN
  Administered 2021-02-13: 500 mL

## 2021-02-13 MED ORDER — LIDOCAINE HCL (CARDIAC) PF 100 MG/5ML IV SOSY
PREFILLED_SYRINGE | INTRAVENOUS | Status: DC | PRN
  Administered 2021-02-13: 80 mg via INTRAVENOUS

## 2021-02-13 SURGICAL SUPPLY — 63 items
ADH SKN CLS APL DERMABOND .7 (GAUZE/BANDAGES/DRESSINGS) ×2
APL PRP STRL LF DISP 70% ISPRP (MISCELLANEOUS)
APPLIER CLIP LOGIC TI 5 (MISCELLANEOUS) IMPLANT
APPLIER CLIP ROT 10 11.4 M/L (STAPLE)
APR CLP MED LRG 11.4X10 (STAPLE)
APR CLP MED LRG 33X5 (MISCELLANEOUS)
BINDER ABDOMINAL 12 SM 30-45 (SOFTGOODS) IMPLANT
BLADE CLIPPER SENSICLIP SURGIC (BLADE) IMPLANT
CATH ROBINSON RED A/P 16FR (CATHETERS) IMPLANT
CHLORAPREP W/TINT 26 (MISCELLANEOUS) IMPLANT
CLIP APPLIE ROT 10 11.4 M/L (STAPLE) IMPLANT
COVER WAND RF STERILE (DRAPES) ×3 IMPLANT
DERMABOND ADVANCED (GAUZE/BANDAGES/DRESSINGS) ×1
DERMABOND ADVANCED .7 DNX12 (GAUZE/BANDAGES/DRESSINGS) ×2 IMPLANT
DEVICE SECURE STRAP 25 ABSORB (INSTRUMENTS) ×3 IMPLANT
DEVICE TROCAR PUNCTURE CLOSURE (ENDOMECHANICALS) ×3 IMPLANT
DRAPE UTILITY XL STRL (DRAPES) ×3 IMPLANT
DRSG OPSITE POSTOP 3X4 (GAUZE/BANDAGES/DRESSINGS) IMPLANT
DURAPREP 26ML APPLICATOR (WOUND CARE) ×3 IMPLANT
ELECT REM PT RETURN 9FT ADLT (ELECTROSURGICAL) ×3
ELECTRODE REM PT RTRN 9FT ADLT (ELECTROSURGICAL) ×2 IMPLANT
GLOVE SURG ENC MOIS LTX SZ7 (GLOVE) ×3 IMPLANT
GLOVE SURG ENC MOIS LTX SZ7.5 (GLOVE) ×9 IMPLANT
GLOVE SURG UNDER POLY LF SZ7 (GLOVE) ×12 IMPLANT
GOWN STRL REUS W/TWL LRG LVL3 (GOWN DISPOSABLE) ×12 IMPLANT
GOWN STRL REUS W/TWL XL LVL3 (GOWN DISPOSABLE) ×3 IMPLANT
HIBICLENS CHG 4% 4OZ (MISCELLANEOUS) IMPLANT
KIT TURNOVER CYSTO (KITS) ×3 IMPLANT
MANIFOLD NEPTUNE II (INSTRUMENTS) ×3 IMPLANT
MESH VENTRALIGHT ST 6X8 (Mesh Specialty) ×3 IMPLANT
MESH VENTRLGHT ELLIPSE 8X6XMFL (Mesh Specialty) ×2 IMPLANT
NEEDLE INSUFFLATION 120MM (ENDOMECHANICALS) ×3 IMPLANT
NEEDLE SPNL 22GX3.5 QUINCKE BK (NEEDLE) ×3 IMPLANT
NS IRRIG 1000ML POUR BTL (IV SOLUTION) IMPLANT
NS IRRIG 500ML POUR BTL (IV SOLUTION) ×3 IMPLANT
PACK BASIN DAY SURGERY FS (CUSTOM PROCEDURE TRAY) ×3 IMPLANT
PACK LAPAROSCOPY BASIN (CUSTOM PROCEDURE TRAY) ×3 IMPLANT
PACK TRENDGUARD 450 HYBRID PRO (MISCELLANEOUS) ×2 IMPLANT
PAD ARMBOARD 7.5X6 YLW CONV (MISCELLANEOUS) IMPLANT
PROTECTOR NERVE ULNAR (MISCELLANEOUS) IMPLANT
SCISSORS LAP 5X35 DISP (ENDOMECHANICALS) ×3 IMPLANT
SET SUCTION IRRIG HYDROSURG (IRRIGATION / IRRIGATOR) IMPLANT
SET TUBE SMOKE EVAC HIGH FLOW (TUBING) ×3 IMPLANT
SOLUTION ELECTROLUBE (MISCELLANEOUS) IMPLANT
SUT CHROMIC 2 0 SH (SUTURE) ×3 IMPLANT
SUT ETHIBOND 0 MO6 C/R (SUTURE) ×3 IMPLANT
SUT MNCRL AB 4-0 PS2 18 (SUTURE) ×3 IMPLANT
SUT NOVA 1 T20/GS 25DT (SUTURE) IMPLANT
SUT PROLENE 2 0 KS (SUTURE) IMPLANT
SUT VIC AB 4-0 PS2 18 (SUTURE) IMPLANT
SUT VICRYL 0 UR6 27IN ABS (SUTURE) IMPLANT
TOWEL OR 17X26 10 PK STRL BLUE (TOWEL DISPOSABLE) ×3 IMPLANT
TRAY FOL W/BAG SLVR 16FR STRL (SET/KITS/TRAYS/PACK) IMPLANT
TRAY FOLEY W/BAG SLVR 16FR LF (SET/KITS/TRAYS/PACK)
TRAY LAPAROSCOPIC (CUSTOM PROCEDURE TRAY) ×3 IMPLANT
TRENDGUARD 450 HYBRID PRO PACK (MISCELLANEOUS) ×3
TROCAR BLADELESS OPT 5 100 (ENDOMECHANICALS) ×6 IMPLANT
TROCAR OPTI TIP 5M 100M (ENDOMECHANICALS) IMPLANT
TROCAR XCEL BLUNT TIP 100MML (ENDOMECHANICALS) IMPLANT
TROCAR XCEL DIL TIP R 11M (ENDOMECHANICALS) IMPLANT
TROCAR XCEL NON-BLD 11X100MML (ENDOMECHANICALS) IMPLANT
WARMER LAPAROSCOPE (MISCELLANEOUS) IMPLANT
WATER STERILE IRR 1000ML POUR (IV SOLUTION) IMPLANT

## 2021-02-13 NOTE — Op Note (Signed)
02/13/2021  9:07 AM  PATIENT:  Holly Estrada  30 y.o. female  PRE-OPERATIVE DIAGNOSIS:  umbilical hernia,   POST-OPERATIVE DIAGNOSIS:  umbilical hernia  PROCEDURE:  Procedure(s): LAPAROSCOPIC UMBILICAL HERNIA WITH MESH  SURGEON:  Surgeon(s) and Role: Panel 1:    Axel Filler, MD - Primary  ANESTHESIA:   general  EBL:  30 mL   BLOOD ADMINISTERED:none  DRAINS: none   LOCAL MEDICATIONS USED:  MARCAINE     SPECIMEN:  No Specimen  DISPOSITION OF SPECIMEN:  N/A  COUNTS:  YES  TOURNIQUET:  * No tourniquets in log *  DICTATION: .Dragon Dictation  Details of the procedure:   Dr. Billy Coast was in the operating room and finished his portion of the procedure.  This will be dictated under separate cover. The abdomen was insufflated.  Subsequently a 5 mm trocar was placed in the left upper quadrant, Palmer's point.  A camera inserted there was no injury to any intra-abdominal organs.    There was seen to be an non-incarcerated 1 cm umbilical hernia.  A second camera port was in placed into the left lower quadrant.   At this the Falicform ligament was taken down with Bovie cautery maintaining hemostasis.   I proceeded to reduce the hernia contents.  The hernia sac was dissected out of the hernia and disposed.  The fascia at the hernia was reapproximated using 0 Ethibonds x 2.  The 11 mm trocar that was placed previously to stay for the umbilicus was removed and a 0 Ethibond was used x1 to reapproximate the fascia in this area.  Once the hernia was cleared away, a Bard Ventralight 15 x 20 cm mesh was inserted into the abdomen.  The mesh was secured circumferentially with am Securestrap tacker in a double crown fashion. At this time the left lower quadrant 5 mm trocar was reapproximated using a 0 Vicryl and Endo Close device.  The omentum was brought over the area of the mesh. The pneumoperitoneum was evacuated  & all trocars  were removed. The skin was reapproximated with 4-0   Monocryl sutures in a subcuticular fashion. The skin was dressed with Dermabond.  The patient was taken to the recovery room in stable condition.  Type of repair -primary suture & mesh  Mesh overlap - 5-6cm  Placement of mesh -  beneath fascia and into peritoneal cavity     PLAN OF CARE: Discharge to home after PACU  PATIENT DISPOSITION:  PACU - hemodynamically stable.   Delay start of Pharmacological VTE agent (>24hrs) due to surgical blood loss or risk of bleeding: not applicable

## 2021-02-13 NOTE — Discharge Instructions (Addendum)
[facility]__[facility]  HERNIA REPAIR: POST OP INSTRUCTIONS  Always review your discharge instruction sheet given to you by the facility where your surgery was performed. IF YOU HAVE DISABILITY OR FAMILY LEAVE FORMS, YOU MUST BRING THEM TO THE OFFICE FOR PROCESSING.   DO NOT GIVE THEM TO YOUR DOCTOR.  1. A  prescription for pain medication may be given to you upon discharge.  Take your pain medication as prescribed, if needed.  If narcotic pain medicine is not needed, then you may take acetaminophen (Tylenol) or ibuprofen (Advil) as needed. 2. Take your usually prescribed medications unless otherwise directed. If you need a refill on your pain medication, please contact your pharmacy.  They will contact our office to request authorization. Prescriptions will not be filled after 5 pm or on week-ends. 3. You should follow a light diet the first 24 hours after arrival home, such as soup and crackers, etc.  Be sure to include lots of fluids daily.  Resume your normal diet the day after surgery. 4.Most patients will experience some swelling and bruising around the umbilicus or in the groin and scrotum.  Ice packs and reclining will help.  Swelling and bruising can take several days to resolve.  6. It is common to experience some constipation if taking pain medication after surgery.  Increasing fluid intake and taking a stool softener (such as Colace) will usually help or prevent this problem from occurring.  A mild laxative (Milk of Magnesia or Miralax) should be taken according to package directions if there are no bowel movements after 48 hours. 7. Unless discharge instructions indicate otherwise, you may remove your bandages 24-48 hours after surgery, and you may shower at that time.  You may have steri-strips (small skin tapes) in place directly over the incision.  These strips should be left on the skin for 7-10 days.  If your surgeon used skin glue on the incision, you may shower in  24 hours.  The glue will flake off over the next 2-3 weeks.  Any sutures or staples will be removed at the office during your follow-up visit. 8. ACTIVITIES:  You may resume regular (light) daily activities beginning the next day--such as daily self-care, walking, climbing stairs--gradually increasing activities as tolerated.  You may have sexual intercourse when it is comfortable.  Refrain from any heavy lifting or straining until approved by your doctor.  a.You may drive when you are no longer taking prescription pain medication, you can comfortably wear a seatbelt, and you can safely maneuver your car and apply brakes. b.RETURN TO WORK:   _____________________________________________  9.You should see your doctor in the office for a follow-up appointment approximately 2-3 weeks after your surgery.  Make sure that you call for this appointment within a day or two after you arrive home to insure a convenient appointment time. 10.OTHER INSTRUCTIONS: _________________________    _____________________________________  WHEN TO CALL YOUR DOCTOR: Fever over 101.0 Inability to urinate Nausea and/or vomiting Extreme swelling or bruising Continued bleeding from incision. Increased pain, redness, or drainage from the incision  The clinic staff is available to answer your questions during regular business hours.  Please don't hesitate to call and ask to speak to one of the nurses for clinical concerns.  If you have a medical emergency, go to the nearest emergency room or call 911.  A surgeon from [facility] is always on call at the hospital   [address], [address], [place], [place]    [address] ?  [address] [address], [place], [place]   [address] [phone] ? [phone] ? FAX [phone] Web site: [email or web address]  No ibuprofen, Advil, Aleve, Motrin, or naproxen until after 3 pm today if needed.  No Tylenol until after 12:30 pm today if needed.   Post Anesthesia  Home Care Instructions  Activity: Get plenty of rest for the remainder of the day. A responsible individual must stay with you for 24 hours following the procedure.  For the next 24 hours, DO NOT: -Drive a car -Advertising copywriter -Drink alcoholic beverages -Take any medication unless instructed by your physician -Make any legal decisions or sign important papers.  Meals: Start with liquid foods such as gelatin or soup. Progress to regular foods as tolerated. Avoid greasy, spicy, heavy foods. If nausea and/or vomiting occur, drink only clear liquids until the nausea and/or vomiting subsides. Call your physician if vomiting continues.  Special Instructions/Symptoms: Your throat may feel dry or sore from the anesthesia or the breathing tube placed in your throat during surgery. If this causes discomfort, gargle with warm salt water. The discomfort should disappear within 24 hours.  If you had a scopolamine patch placed behind your ear for the management of post- operative nausea and/or vomiting:  1. The medication in the patch is effective for 72 hours, after which it should be removed.  Wrap patch in a tissue and discard in the trash. Wash hands thoroughly with soap and water. 2. You may remove the patch earlier than 72 hours if you experience unpleasant side effects which may include dry mouth, dizziness or visual disturbances. 3. Avoid touching the patch. Wash your hands with soap and water after contact with the patch.      DISCHARGE INSTRUCTIONS: GYN Laparoscopy  The following instructions have been prepared to help you care for yourself upon your return home today.  Wound care:  Do not get the incision wet for the first 24 hours. The incision should be kept clean and dry.  The Band-Aids or dressings may be removed the day after surgery.  Should the incision become sore, red, and swollen after the first week, check with your doctor.  Personal hygiene:  Shower the day after your  procedure.  Activity and limitations:  Do NOT drive or operate any equipment today.  Do NOT lift anything more than 15 pounds for 2-3 weeks after surgery.  Do NOT rest in bed all day.  Walking is encouraged. Walk each day, starting slowly with 5-minute walks 3 or 4 times a day. Slowly increase the length of your walks.  Walk up and down stairs slowly.  Do NOT do strenuous activities, such as golfing, playing tennis, bowling, running, biking, weight lifting, gardening, mowing, or vacuuming for 2-4 weeks. Ask your doctor when it is okay to start.  Diet: Eat a light meal as desired this evening. You may resume your usual diet tomorrow.  Return to work: This is dependent on the type of work you do. For the most part you can return to a desk job within a week of surgery. If you are more active at work, please discuss this with your doctor.  What to expect after your surgery: You may have a slight burning sensation when you urinate on the first day. You may have a very small amount of blood in the urine. Expect to have a small amount of vaginal discharge/light bleeding for 1-2 weeks. It is not unusual to have abdominal soreness and bruising for up to  2 weeks. You may be tired and need more rest for about 1 week. You may experience shoulder pain for 24-72 hours. Lying flat in bed may relieve it.  Call your doctor for any of the following:  Develop a fever of 100.4 or greater  Inability to urinate 6 hours after discharge from hospital  Severe pain not relieved by pain medications  Persistent of heavy bleeding at incision site  Redness or swelling around incision site after a week  Increasing nausea or vomiting

## 2021-02-13 NOTE — Transfer of Care (Signed)
Immediate Anesthesia Transfer of Care Note  Patient: Holly Estrada  Procedure(s) Performed: LAPAROSCOPIC UMBILICAL HERNIA WITH MESH/LAPAROSCOPIC BILATERAL TUBAL LIGATION (Abdomen) BILATERAL LAPAROSCOPIC TUBAL LIGATION (Bilateral)  Patient Location: PACU  Anesthesia Type:General  Level of Consciousness: awake, alert  and oriented  Airway & Oxygen Therapy: Patient Spontanous Breathing and Patient connected to face mask oxygen  Post-op Assessment: Report given to RN and Post -op Vital signs reviewed and stable  Post vital signs: Reviewed and stable  Last Vitals:  Vitals Value Taken Time  BP 138/79 02/13/21 0923  Temp    Pulse 92 02/13/21 0925  Resp 17 02/13/21 0925  SpO2 100 % 02/13/21 0925  Vitals shown include unvalidated device data.  Last Pain:  Vitals:   02/13/21 0647  TempSrc: Oral  PainSc: 0-No pain      Patients Stated Pain Goal: 3 (02/13/21 4782)  Complications: No notable events documented.

## 2021-02-13 NOTE — H&P (Signed)
Holly Estrada is an 30 y.o. female. Desire for elective sterilization.  Pertinent Gynecological History: Menses: flow is moderate Bleeding: na Contraception: none DES exposure: denies Blood transfusions: none Sexually transmitted diseases: no past history Previous GYN Procedures: DNC  Last mammogram:  na  Date: na Last pap: normal Date: 2021 OB History: G2, P2   Menstrual History: Menarche age: 8 Patient's last menstrual period was 01/27/2021 (exact date).    Past Medical History:  Diagnosis Date   History of Guillain-Barre syndrome 2013   02-10-2021  per pt no residual   History of pregnancy induced hypertension 2017   postpartum preeclampsia   Umbilical hernia     Past Surgical History:  Procedure Laterality Date   CESAREAN SECTION N/A 12/15/2015   Procedure: CESAREAN SECTION;  Surgeon: Suzy Bouchard, MD;  Location: [facility] ORS;  Service: Obstetrics;  Laterality: N/A;   CESAREAN SECTION N/A 08/22/2019   Procedure: Repeat CESAREAN SECTION;  Surgeon: Olivia Mackie, MD;  Location: [facility] ORS;  Service: Obstetrics;  Laterality: N/A;  EDD: 08/30/19    Family History  Problem Relation Age of Onset   Kidney failure Maternal Grandfather     Social History:  reports that she has never smoked. She has never used smokeless tobacco. She reports that she does not drink alcohol and does not use drugs.  Allergies: No Known Allergies  Medications Prior to Admission  Medication Sig Dispense Refill Last Dose   cetirizine (ZYRTEC) 10 MG tablet Take 10 mg by mouth at bedtime.      Lactic Ac-Citric Ac-Pot Bitart (PHEXXI) 1.8-1-0.4 % GEL Place vaginally as needed. Per pt insert in vaginal per to sexual intercourse      OVER THE COUNTER MEDICATION Per pt name is [name], one teaspoon daily      Prenatal Vit-Fe Fumarate-FA (PRENATAL MULTIVITAMIN) TABS tablet Take 1 tablet by mouth daily at 12 noon.      Probiotic Product (PROBIOTIC DAILY PO) Take by mouth at bedtime.        Review of Systems  Constitutional: Negative.   All other systems reviewed and are negative.  Height 5\' 5"  (1.651 m), weight 76.2 kg, last menstrual period 01/27/2021, not currently breastfeeding. Physical Exam Constitutional:      Appearance: Normal appearance. She is normal weight.  HENT:     Head: Normocephalic and atraumatic.  Cardiovascular:     Rate and Rhythm: Normal rate and regular rhythm.  Pulmonary:     Effort: Pulmonary effort is normal.     Breath sounds: Normal breath sounds.  Abdominal:     Palpations: Abdomen is soft.  Genitourinary:    General: Normal vulva.  Musculoskeletal:        General: Normal range of motion.     Cervical back: Normal range of motion and neck supple.  Skin:    General: Skin is warm and dry.  Neurological:     General: No focal deficit present.     Mental Status: She is alert and oriented to person, place, and time.  Psychiatric:        Mood and Affect: Mood normal.        Behavior: Behavior normal.    Results for orders placed or performed during the hospital encounter of 02/13/21 (from the past 24 hour(s))  Pregnancy, urine POC     Status: None   Collection Time: 02/13/21  6:21 AM  Result Value Ref Range   Preg Test, Ur NEGATIVE NEGATIVE    No results found.  Assessment/Plan: Desire for Elective TL LTL Surgical risks discussed. Consent done.  Mikaia Janvier J 02/13/2021, 6:30 AM

## 2021-02-13 NOTE — Anesthesia Postprocedure Evaluation (Signed)
Anesthesia Post Note  Patient: Holly Estrada  Procedure(s) Performed: LAPAROSCOPIC UMBILICAL HERNIA WITH MESH/LAPAROSCOPIC BILATERAL TUBAL LIGATION (Abdomen) BILATERAL LAPAROSCOPIC TUBAL LIGATION (Bilateral)     Patient location during evaluation: PACU Anesthesia Type: General Level of consciousness: awake and alert and oriented Pain management: pain level controlled Vital Signs Assessment: post-procedure vital signs reviewed and stable Respiratory status: spontaneous breathing, nonlabored ventilation and respiratory function stable Cardiovascular status: blood pressure returned to baseline and stable Postop Assessment: no apparent nausea or vomiting Anesthetic complications: no   No notable events documented.  Last Vitals:  Vitals:   02/13/21 0945 02/13/21 1000  BP: 129/87 (!) 136/92  Pulse: 71 71  Resp: 11 14  Temp:    SpO2: 99% 100%    Last Pain:  Vitals:   02/13/21 1000  TempSrc:   PainSc: 0-No pain                 Holly Estrada A.

## 2021-02-13 NOTE — Anesthesia Procedure Notes (Signed)
Procedure Name: Intubation Date/Time: 02/13/2021 8:17 AM Performed by: Verita Lamb, CRNA Pre-anesthesia Checklist: Patient identified, Emergency Drugs available, Suction available and Patient being monitored Patient Re-evaluated:Patient Re-evaluated prior to induction Oxygen Delivery Method: Circle system utilized Preoxygenation: Pre-oxygenation with 100% oxygen Induction Type: IV induction Ventilation: Mask ventilation without difficulty Laryngoscope Size: Mac and 4 Tube type: Oral Number of attempts: 1 Airway Equipment and Method: Stylet and Oral airway Placement Confirmation: ETT inserted through vocal cords under direct vision, positive ETCO2, breath sounds checked- equal and bilateral and CO2 detector Secured at: 23 cm Tube secured with: Tape Dental Injury: Teeth and Oropharynx as per pre-operative assessment

## 2021-02-13 NOTE — H&P (Signed)
Patient seen and examined. Consent witnessed and signed. No changes noted. Update completed.  BP (!) 125/94   Pulse 72   Temp 98.8 F (37.1 C) (Oral)   Resp (!) 24   Ht 5\' 5"  (1.651 m)   Wt 76.4 kg   LMP 01/27/2021 (Exact Date)   SpO2 100%   Breastfeeding No   BMI 28.04 kg/m  CBC    Component Value Date/Time   WBC 5.7 02/13/2021 0708   RBC 4.12 02/13/2021 0708   HGB 12.4 02/13/2021 0708   HCT 37.2 02/13/2021 0708   PLT 197 02/13/2021 0708   MCV 90.3 02/13/2021 0708   MCH 30.1 02/13/2021 0708   MCHC 33.3 02/13/2021 0708   RDW 12.2 02/13/2021 0708   LYMPHSABS 1.8 11/16/2020 1847   MONOABS 0.5 11/16/2020 1847   EOSABS 0.2 11/16/2020 1847   BASOSABS 0.1 11/16/2020 1847   Hcg neg

## 2021-02-13 NOTE — Op Note (Signed)
02/13/2021  8:37 AM  PATIENT:  [name]  30 y.o. female  PRE-OPERATIVE DIAGNOSIS:  umbilical hernia, DESIRED STERILIZATION  POST-OPERATIVE DIAGNOSIS:  umbilical hernia, DESIRED STERILIZATION  PROCEDURE:  Procedure(s): BILATERAL LAPAROSCOPIC TUBAL LIGATION  SURGEON:  Nayleen Janosik  ASSISTANTS: none   ANESTHESIA:   local and general  ESTIMATED BLOOD LOSS: minimal   DRAINS: Urinary Catheter (Foley)   LOCAL MEDICATIONS USED:  NONE  SPECIMEN:  No Specimen  DISPOSITION OF SPECIMEN:  N/A  COUNTS:  YES  DICTATION #: [id number]  PLAN OF CARE: dc home  PATIENT DISPOSITION:  PACU - hemodynamically stable.

## 2021-02-13 NOTE — Op Note (Signed)
Holly Estrada, Holly Estrada MEDICAL RECORD NO: [id number] ACCOUNT NO: [id number] DATE OF BIRTH: April 18, 1991 FACILITY: [facility] LOCATION: [facility] PHYSICIAN: Lenoard Aden, MD  Operative Report   DATE OF PROCEDURE: 02/13/2021  PREOPERATIVE DIAGNOSIS:  Desire for elective sterilization.  POSTOPERATIVE DIAGNOSIS:  Desire for elective sterilization.  PROCEDURE:  Laparoscopic tubal ligation with bipolar cautery.  SURGEON:  Olivia Mackie, MD  ASSISTANT:  None.  ANESTHESIA:  General.  ESTIMATED BLOOD LOSS:  Less than 50 mL.  COMPLICATIONS:  None.  DRAINS:  Foley.  COUNTS:  Correct.  The patient currently undergoing a mesh hernia repair by Dr. Derrell Lolling.  BRIEF OPERATIVE NOTE:  After being apprised of the risks of anesthesia, infection, bleeding, injury to surrounding organs, possible need for repair, delayed versus immediate complications include bowel and bladder injury, possible need for repair,  failure risk of tubal ligation of 5-10 per thousand, the patient was brought to the operating room.  She was administered general anesthetic without complication.  She was prepped and draped in the usual sterile fashion.  Feet were placed in the Yellofin  stirrups.  Exam under anesthesia reveals a mobile, anteflexed uterus and no adnexal masses.  Hulka tenaculum placed vaginally without difficulty.  At this time, attention turned to the umbilical portion of the procedure, whereby previous umbilical  hernia has been duly noted.  CT scan revealed fatty tissue in the sac.  No evidence of bowel contents in the sac.  Therefore, decision was made to proceed with a Veress needle entry at the lower aspect of this hernia insufflation without difficulty.   Opening pressure of -2 noted, 4 liters of CO2 insufflated without difficulty.  Small incision made, 11 mm trocar placed atraumatically and visualization obtained.  Pictures taken at this time.  Normal liver, gallbladder area. Normal appendiceal area.    There is evidence of omental adhesions to this ventral hernia sac, but no evidence of bowel contents.  No evidence of bowel injury at this time.  Anterior and posterior cul-de-sac appeared normal.  Both tubes appeared normal.  Small paratubal cyst on the  left.  At this time, Kleppinger bipolar cautery was entered and three contiguous areas of the right and left tube were cauterized to a resistance of zero.  Both tubes were divided using long straight scissors.  Tubal lumens visualized.  CO2 was  released.  No bleeding was noted. At this time, trocars left in situ and Dr. Derrell Lolling to proceed with mesh hernia repair and dictate the remainder of the note.   SHW D: 02/13/2021 8:41:04 am T: 02/13/2021 9:13:00 am  JOB: [id number]/ [id number]

## 2021-02-14 ENCOUNTER — Encounter (HOSPITAL_BASED_OUTPATIENT_CLINIC_OR_DEPARTMENT_OTHER): Payer: Self-pay | Admitting: General Surgery

## 2021-09-16 DIAGNOSIS — M542 Cervicalgia: Secondary | ICD-10-CM | POA: Diagnosis not present

## 2021-09-16 DIAGNOSIS — S46011A Strain of muscle(s) and tendon(s) of the rotator cuff of right shoulder, initial encounter: Secondary | ICD-10-CM | POA: Diagnosis not present

## 2021-09-16 DIAGNOSIS — M79644 Pain in right finger(s): Secondary | ICD-10-CM | POA: Diagnosis not present

## 2021-09-30 IMAGING — CT CT ABD-PELV W/ CM
1 of 2 series · 15 of 32 positions shown, 19 images · IV contrast (APPLIED)
Comparison: None.

CLINICAL DATA: Abdominal pain.  Umbilical hernia

EXAM:
CT ABDOMEN AND PELVIS WITH CONTRAST
TECHNIQUE: Multidetector CT imaging of the abdomen and pelvis was performed
using the standard protocol following bolus administration of
intravenous contrast.
CONTRAST:  100mL OMNIPAQUE IOHEXOL 300 MG/ML  SOLN

[Series 2: abd pel w · axial · 0.68mm/px · z∈[+869,+1244]mm · 15 of 83 slices shown, 19 images]
[im 4/83  soft-tissue]
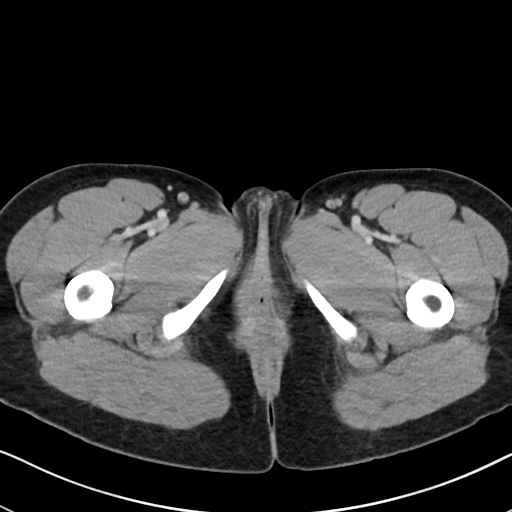
[im 4/83  bone]
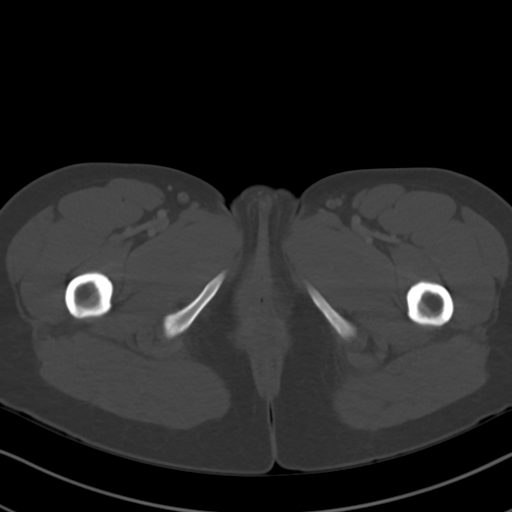
[im 10/83  soft-tissue]
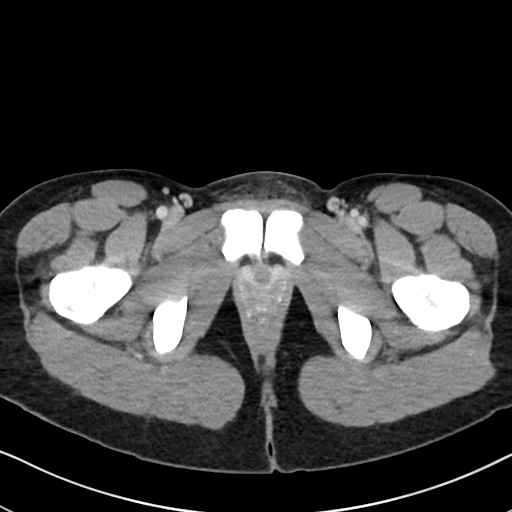
[im 16/83  soft-tissue]
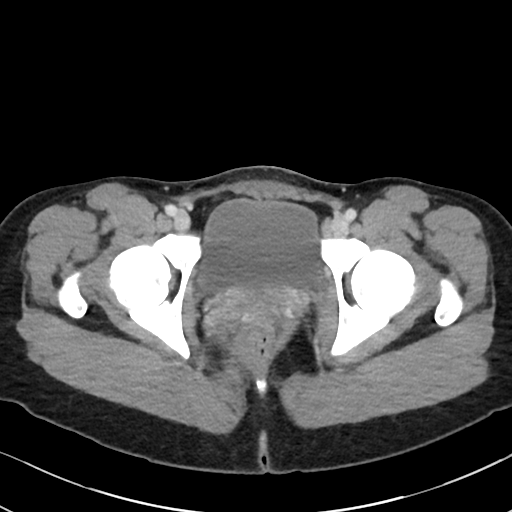
[im 23/83  soft-tissue]
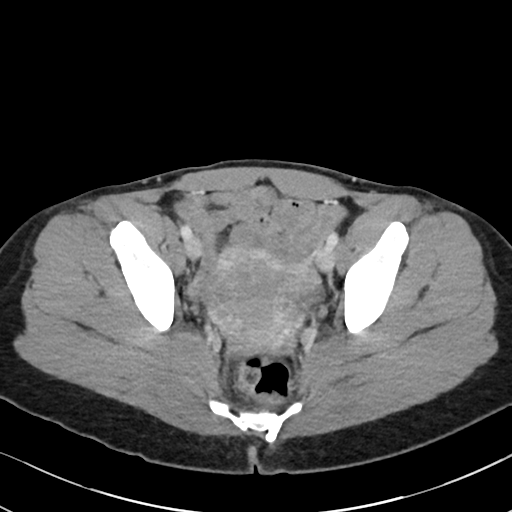
[im 29/83  soft-tissue]
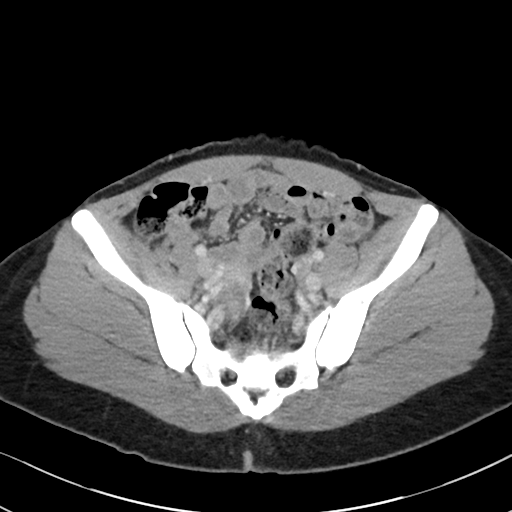
[im 35/83  soft-tissue]
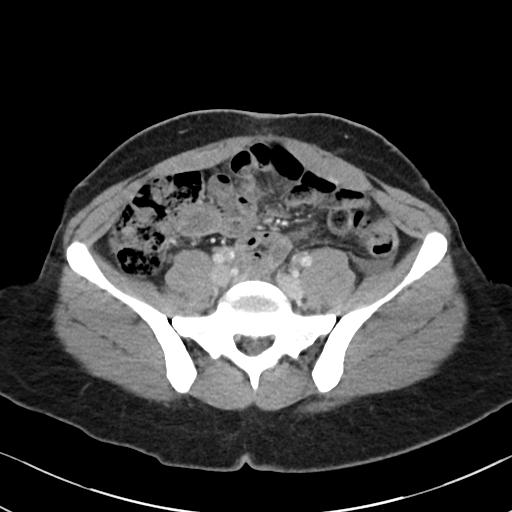
[im 42/83  soft-tissue]
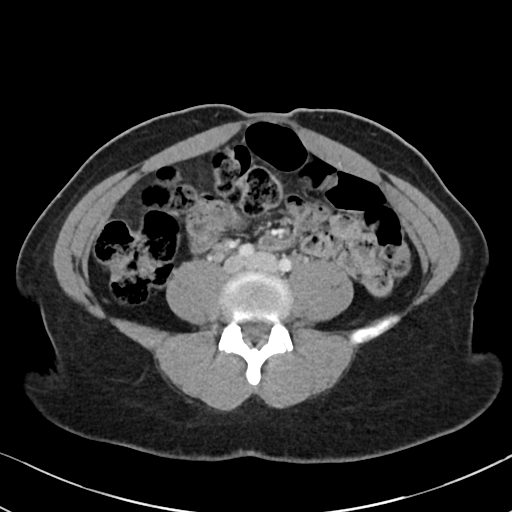
[im 48/83  soft-tissue]
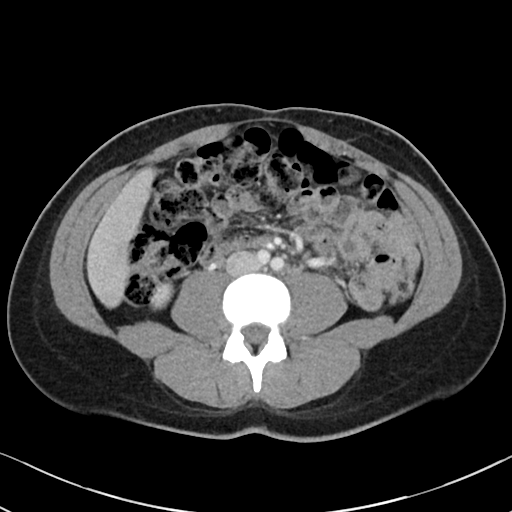
[im 54/83  soft-tissue]
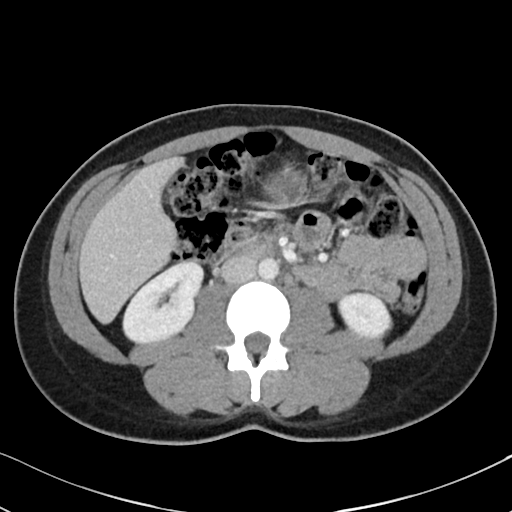
[im 54/83  bone]
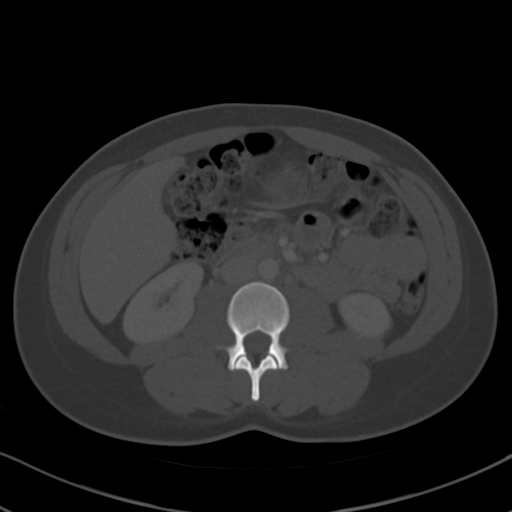
[im 60/83  soft-tissue]
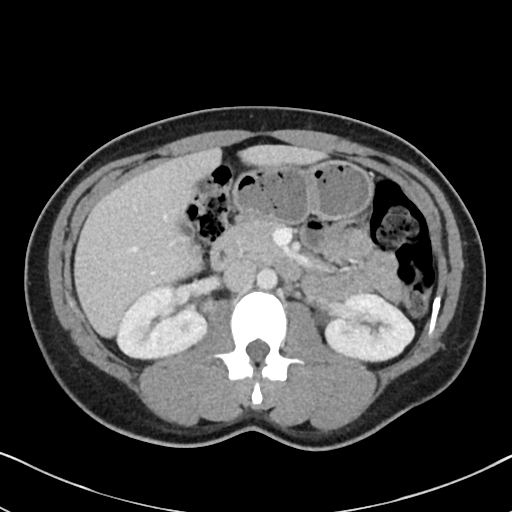
[im 67/83  soft-tissue]
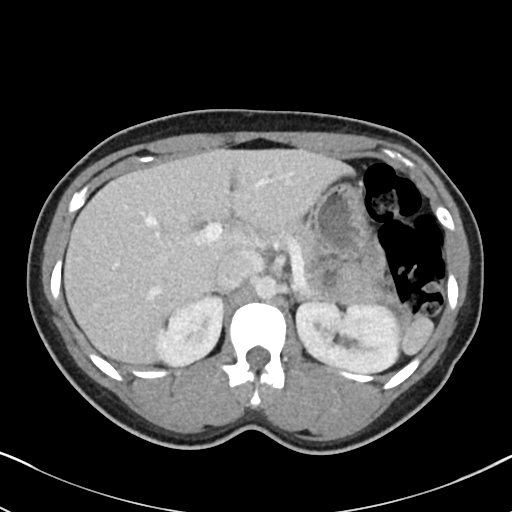
[im 70/83  lung]
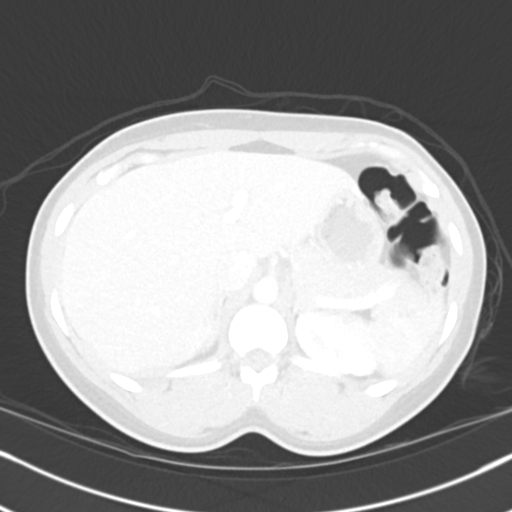
[im 73/83  soft-tissue]
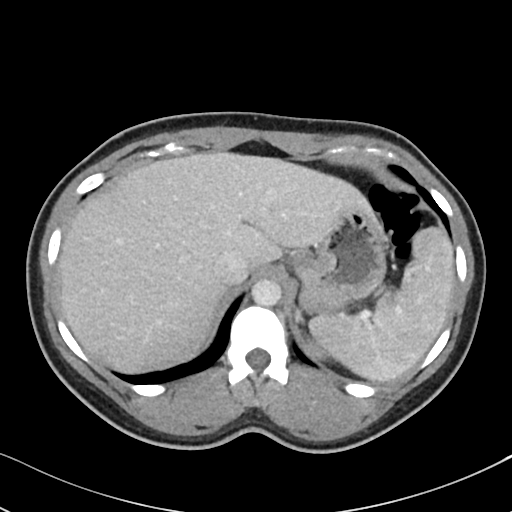
[im 73/83  lung]
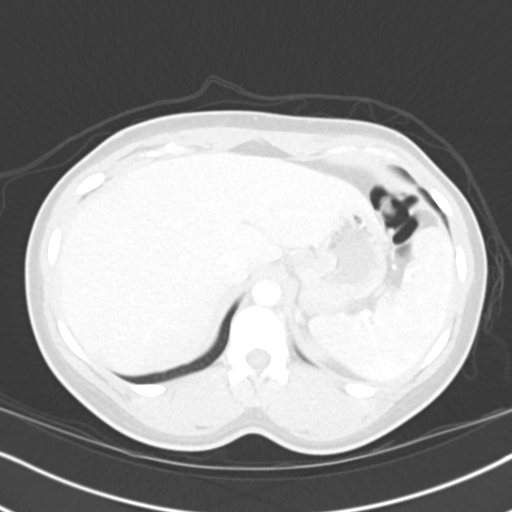
[im 76/83  lung]
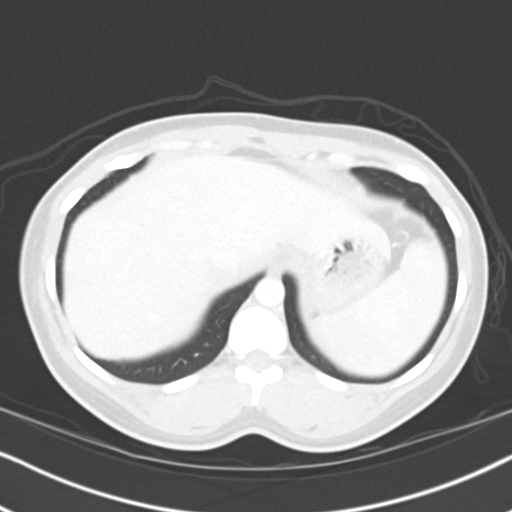
[im 79/83  soft-tissue]
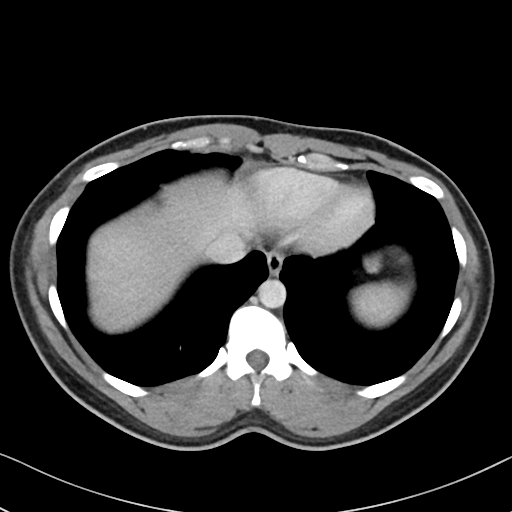
[im 79/83  lung]
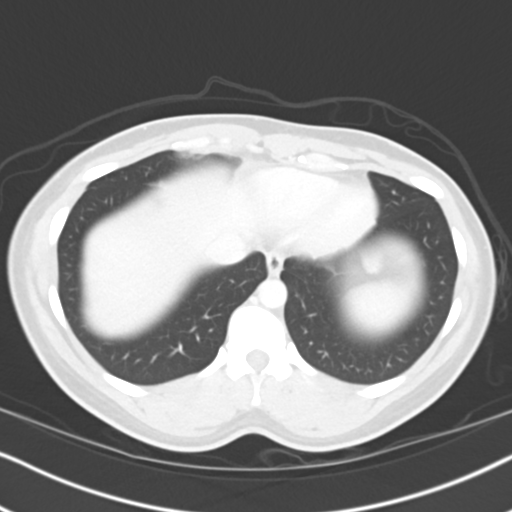

[15 of 32 positions shown; findings below may reference images not displayed]

FINDINGS: Lower chest: Included lung bases are clear.  Heart size is normal.

Hepatobiliary: No focal liver abnormality is seen. No gallstones,
gallbladder wall thickening, or biliary dilatation.

Pancreas: Unremarkable. No pancreatic ductal dilatation or
surrounding inflammatory changes.

Spleen: Normal in size without focal abnormality.

Adrenals/Urinary Tract: Unremarkable adrenal glands. Kidneys enhance
symmetrically without focal lesion, stone, or hydronephrosis.
Ureters are nondilated. Urinary bladder appears unremarkable.

Stomach/Bowel: Stomach is within normal limits. Appendix appears
normal (series 2, image 51). Small bowel within normal limits.
Moderate volume of stool throughout the colon. Colon is redundant.
No evidence of bowel wall thickening, distention, or inflammatory
changes.

Vascular/Lymphatic: No significant vascular findings are present. No
enlarged abdominal or pelvic lymph nodes.

Reproductive: Uterus within normal limits. Involuting or recently
ruptured small right ovarian cyst (series 5, image 37). Left adnexal
region within normal limits.

Other: Trace free fluid within the pelvis, likely physiologic or
related to cyst rupture. No organized abdominopelvic fluid
collection. No pneumoperitoneum. Small fat containing umbilical
hernia. Hernia neck measures approximately 6 mm. Small amount of
fluid within the hernia sac.

Musculoskeletal: No acute or significant osseous findings.
IMPRESSION: 1. Small fat-containing umbilical hernia with a small amount of
fluid within the hernia sac.
2. Involuting or recently ruptured small right ovarian cyst. Trace
free fluid within the pelvis, likely physiologic or related to cyst
rupture.
3. Moderate volume of stool throughout the colon.

## 2021-10-19 DIAGNOSIS — Z03818 Encounter for observation for suspected exposure to other biological agents ruled out: Secondary | ICD-10-CM | POA: Diagnosis not present

## 2021-10-19 DIAGNOSIS — J019 Acute sinusitis, unspecified: Secondary | ICD-10-CM | POA: Diagnosis not present

## 2021-10-19 DIAGNOSIS — B9689 Other specified bacterial agents as the cause of diseases classified elsewhere: Secondary | ICD-10-CM | POA: Diagnosis not present

## 2022-04-15 NOTE — Congregational Nurse Program (Signed)
Seen at [facility].  BP reading discussed, encouraged to  take meds.  Followup with MD.  Clarissa Laird, RN Congregational Nurse [phone] 

## 2022-05-14 ENCOUNTER — Ambulatory Visit: Payer: Self-pay | Admitting: Nurse Practitioner

## 2022-09-10 ENCOUNTER — Encounter: Payer: Self-pay | Admitting: Nurse Practitioner

## 2022-09-10 ENCOUNTER — Ambulatory Visit (INDEPENDENT_AMBULATORY_CARE_PROVIDER_SITE_OTHER): Payer: 59 | Admitting: Nurse Practitioner

## 2022-09-10 VITALS — BP 138/78 | HR 85 | Temp 98.5°F | Ht 64.0 in | Wt 168.0 lb

## 2022-09-10 DIAGNOSIS — Z13228 Encounter for screening for other metabolic disorders: Secondary | ICD-10-CM | POA: Diagnosis not present

## 2022-09-10 DIAGNOSIS — I10 Essential (primary) hypertension: Secondary | ICD-10-CM | POA: Diagnosis not present

## 2022-09-10 DIAGNOSIS — Z7689 Persons encountering health services in other specified circumstances: Secondary | ICD-10-CM | POA: Diagnosis not present

## 2022-09-10 DIAGNOSIS — Z1159 Encounter for screening for other viral diseases: Secondary | ICD-10-CM

## 2022-09-10 NOTE — Patient Instructions (Signed)
Hypertension, Adult High blood pressure (hypertension) is when the force of blood pumping through the arteries is too strong. The arteries are the blood vessels that carry blood from the heart throughout the body. Hypertension forces the heart to work harder to pump blood and may cause arteries to become narrow or stiff. Untreated or uncontrolled hypertension can lead to a heart attack, heart failure, a stroke, kidney disease, and other problems. A blood pressure reading consists of a higher number over a lower number. Ideally, your blood pressure should be below 120/80. The first ("top") number is called the systolic pressure. It is a measure of the pressure in your arteries as your heart beats. The second ("bottom") number is called the diastolic pressure. It is a measure of the pressure in your arteries as the heart relaxes. What are the causes? The exact cause of this condition is not known. There are some conditions that result in high blood pressure. What increases the risk? Certain factors may make you more likely to develop high blood pressure. Some of these risk factors are under your control, including: Smoking. Not getting enough exercise or physical activity. Being overweight. Having too much fat, sugar, calories, or salt (sodium) in your diet. Drinking too much alcohol. Other risk factors include: Having a personal history of heart disease, diabetes, high cholesterol, or kidney disease. Stress. Having a family history of high blood pressure and high cholesterol. Having obstructive sleep apnea. Age. The risk increases with age. What are the signs or symptoms? High blood pressure may not cause symptoms. Very high blood pressure (hypertensive crisis) may cause: Headache. Fast or irregular heartbeats (palpitations). Shortness of breath. Nosebleed. Nausea and vomiting. Vision changes. Severe chest pain, dizziness, and seizures. How is this diagnosed? This condition is diagnosed by  measuring your blood pressure while you are seated, with your arm resting on a flat surface, your legs uncrossed, and your feet flat on the floor. The cuff of the blood pressure monitor will be placed directly against the skin of your upper arm at the level of your heart. Blood pressure should be measured at least twice using the same arm. Certain conditions can cause a difference in blood pressure between your right and left arms. If you have a high blood pressure reading during one visit or you have normal blood pressure with other risk factors, you may be asked to: Return on a different day to have your blood pressure checked again. Monitor your blood pressure at home for 1 week or longer. If you are diagnosed with hypertension, you may have other blood or imaging tests to help your health care provider understand your overall risk for other conditions. How is this treated? This condition is treated by making healthy lifestyle changes, such as eating healthy foods, exercising more, and reducing your alcohol intake. You may be referred for counseling on a healthy diet and physical activity. Your health care provider may prescribe medicine if lifestyle changes are not enough to get your blood pressure under control and if: Your systolic blood pressure is above 130. Your diastolic blood pressure is above 80. Your personal target blood pressure may vary depending on your medical conditions, your age, and other factors. Follow these instructions at home: Eating and drinking  Eat a diet that is high in fiber and potassium, and low in sodium, added sugar, and fat. An example of this eating plan is called the DASH diet. DASH stands for Dietary Approaches to Stop Hypertension. To eat this way: Eat   plenty of fresh fruits and vegetables. Try to fill one half of your plate at each meal with fruits and vegetables. Eat whole grains, such as whole-wheat pasta, brown rice, or whole-grain bread. Fill about one  fourth of your plate with whole grains. Eat or drink low-fat dairy products, such as skim milk or low-fat yogurt. Avoid fatty cuts of meat, processed or cured meats, and poultry with skin. Fill about one fourth of your plate with lean proteins, such as fish, chicken without skin, beans, eggs, or tofu. Avoid pre-made and processed foods. These tend to be higher in sodium, added sugar, and fat. Reduce your daily sodium intake. Many people with hypertension should eat less than 1,500 mg of sodium a day. Do not drink alcohol if: Your health care provider tells you not to drink. You are pregnant, may be pregnant, or are planning to become pregnant. If you drink alcohol: Limit how much you have to: 0-1 drink a day for women. 0-2 drinks a day for men. Know how much alcohol is in your drink. In the [place] one drink equals one 12 oz bottle of beer (355 mL), one 5 oz glass of wine (148 mL), or one 1 oz glass of hard liquor (44 mL). Lifestyle  Work with your health care provider to maintain a healthy body weight or to lose weight. Ask what an ideal weight is for you. Get at least 30 minutes of exercise that causes your heart to beat faster (aerobic exercise) most days of the week. Activities may include walking, swimming, or biking. Include exercise to strengthen your muscles (resistance exercise), such as Pilates or lifting weights, as part of your weekly exercise routine. Try to do these types of exercises for 30 minutes at least 3 days a week. Do not use any products that contain nicotine or tobacco. These products include cigarettes, chewing tobacco, and vaping devices, such as e-cigarettes. If you need help quitting, ask your health care provider. Monitor your blood pressure at home as told by your health care provider. Keep all follow-up visits. This is important. Medicines Take over-the-counter and prescription medicines only as told by your health care provider. Follow directions carefully. Blood  pressure medicines must be taken as prescribed. Do not skip doses of blood pressure medicine. Doing this puts you at risk for problems and can make the medicine less effective. Ask your health care provider about side effects or reactions to medicines that you should watch for. Contact a health care provider if you: Think you are having a reaction to a medicine you are taking. Have headaches that keep coming back (recurring). Feel dizzy. Have swelling in your ankles. Have trouble with your vision. Get help right away if you: Develop a severe headache or confusion. Have unusual weakness or numbness. Feel faint. Have severe pain in your chest or abdomen. Vomit repeatedly. Have trouble breathing. These symptoms may be an emergency. Get help right away. Call 911. Do not wait to see if the symptoms will go away. Do not drive yourself to the hospital. Summary Hypertension is when the force of blood pumping through your arteries is too strong. If this condition is not controlled, it may put you at risk for serious complications. Your personal target blood pressure may vary depending on your medical conditions, your age, and other factors. For most people, a normal blood pressure is less than 120/80. Hypertension is treated with lifestyle changes, medicines, or a combination of both. Lifestyle changes include losing weight, eating a healthy,   low-sodium diet, exercising more, and limiting alcohol. This information is not intended to replace advice given to you by your health care provider. Make sure you discuss any questions you have with your health care provider. Document Revised: 06/17/2021 Document Reviewed: 06/17/2021 Elsevier Patient Education  2023 Elsevier Inc.  

## 2022-09-10 NOTE — Progress Notes (Signed)
I,Tianna Badgett,acting as a Education administrator for [employer], FNP.,have documented all relevant documentation on the behalf of Minette Brine, FNP,as directed by  Minette Brine, FNP while in the presence of Minette Brine, [place].  Subjective:     Patient ID: Holly Estrada , female    DOB: 1990-09-05 , 32 y.o.   MRN: [id number]   Chief Complaint  Patient presents with   Establish Care    HPI  Patient presents today to establish care. She has not seen a primary care provider. She was referred by NP at [facility]. She works as a Research scientist (medical). Married with 2 children. She goes to [place] - she is due for an appt.   PMH - she had guillain-barre syndrome in 2014 noticed this after the flu vaccine.   She just started back taking her HCTZ last Wednesday. After she had her daughter she had elevated blood pressure. She was on the medication.   She was supposed to come in September but had to cancel and she was out of medication for 3 months.  She will get headaches when she is stressed. July 6,2023 149/98 again 140/99.  Her highest bp 159/110.  She generally eating healthy. She has been more aware of reading labels. She has tried to cut back on her MSG. She is drinking one gallon of water daily and exercises daily. 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 She is trying to do more stress care. She notices in the summer her blood pressure is better. She has cut back on her doritos. She is using an app to check her food. 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 Her weight was 176 lbs and now down to 164 lbs.       Past Medical History:  Diagnosis Date   History of Guillain-Barre syndrome 2013   02-10-2021  per pt no residual   History of pregnancy induced hypertension 2017   postpartum preeclampsia   PONV (postoperative nausea and vomiting)    Umbilical hernia      Family History  Problem Relation  Age of Onset   Hypertension Mother    Healthy Father    Cancer Maternal Grandmother    Kidney failure Maternal Grandfather      Current Outpatient Medications:    hydrochlorothiazide (HYDRODIURIL) 25 MG tablet, 1/2 tablet po daily, Disp: , Rfl:    tiZANidine (ZANAFLEX) 2 MG tablet, 1/4-1 tablet up to 4 times a day, Disp: , Rfl:    cetirizine (ZYRTEC) 10 MG tablet, Take 10 mg by mouth at bedtime., Disp: , Rfl:    OVER THE COUNTER MEDICATION, Per pt name is [name], one teaspoon daily, Disp: , Rfl:    Probiotic Product (PROBIOTIC DAILY PO), Take by mouth at bedtime., Disp: , Rfl:    No Known Allergies   Review of Systems  Constitutional: Negative.   Respiratory: Negative.    Cardiovascular:  Positive for leg swelling.  Gastrointestinal: Negative.   Neurological: Negative.   Psychiatric/Behavioral: Negative.       Today's Vitals   09/10/22 1503  BP: 138/78  Pulse: 85  Temp: 98.5 F (36.9 C)  TempSrc: Oral  Weight: 168 lb (76.2 kg)  Height: 5\' 4"  (1.626 m)   Body mass index is 28.84 kg/m.   Objective:  Physical Exam Vitals reviewed.  Constitutional:      Appearance: She is well-developed.  HENT:  Head: Normocephalic and atraumatic.  Eyes:     Pupils: Pupils are equal, round, and reactive to light.  Cardiovascular:     Rate and Rhythm: Normal rate and regular rhythm.     Pulses: Normal pulses.     Heart sounds: Normal heart sounds. No murmur heard. Pulmonary:     Effort: Pulmonary effort is normal.     Breath sounds: Normal breath sounds.  Musculoskeletal:        General: Normal range of motion.  Skin:    General: Skin is warm and dry.     Capillary Refill: Capillary refill takes less than 2 seconds.  Neurological:     General: No focal deficit present.     Mental Status: She is alert and oriented to person, place, and time.     Cranial Nerves: No cranial nerve deficit.  Psychiatric:        Mood and Affect: Mood normal.         Assessment  And Plan:     1. Essential hypertension Comments: Blood pressure is fairly controlled.  Continue current medications. - CMP14+EGFR - CBC - Microalbumin / Creatinine Urine Ratio - POCT Urinalysis Dipstick (81002)  2. Encounter for screening for metabolic disorder - Hemoglobin A1c  3. Encounter for hepatitis C screening test for low risk patient Will check Hepatitis C screening due to recent recommendations to screen all adults 18 years and older - Hepatitis C antibody  4. Establishing care with new doctor, encounter for Patient is here to establish care. Went over patient medical, family, social and surgical history. Reviewed with patient their medications and any allergies  Reviewed with patient their sexual orientation, drug/tobacco and alcohol use Dicussed any new concerns with patient  recommended patient comes in for a physical exam and complete blood work.  Educated patient about the importance of annual screenings and immunizations.  Advised patient to eat a healthy diet along with exercise for atleast 30-45 min atleast 4-5 days of the week.     Patient was given opportunity to ask questions. Patient verbalized understanding of the plan and was able to repeat key elements of the plan. All questions were answered to their satisfaction.  Minette Brine, FNP   I, Minette Brine, FNP, have reviewed all documentation for this visit. The documentation on 09/10/22 for the exam, diagnosis, procedures, and orders are all accurate and complete.   IF YOU HAVE BEEN REFERRED TO A SPECIALIST, IT MAY TAKE 1-2 WEEKS TO SCHEDULE/PROCESS THE REFERRAL. IF YOU HAVE NOT HEARD FROM US/SPECIALIST IN TWO WEEKS, PLEASE GIVE [place] A CALL AT [phone] X [phone].   THE PATIENT IS ENCOURAGED TO PRACTICE SOCIAL DISTANCING DUE TO THE COVID-19 PANDEMIC.

## 2022-09-11 LAB — CMP14+EGFR
ALT: 18 IU/L (ref 0–32)
AST: 21 IU/L (ref 0–40)
Albumin/Globulin Ratio: 1.5 (ref 1.2–2.2)
Albumin: 4.6 g/dL (ref 3.9–4.9)
Alkaline Phosphatase: 56 IU/L (ref 44–121)
BUN/Creatinine Ratio: 14 (ref 9–23)
BUN: 12 mg/dL (ref 6–20)
Bilirubin Total: 0.3 mg/dL (ref 0.0–1.2)
CO2: 23 mmol/L (ref 20–29)
Calcium: 9.5 mg/dL (ref 8.7–10.2)
Chloride: 101 mmol/L (ref 96–106)
Creatinine, Ser: 0.86 mg/dL (ref 0.57–1.00)
Globulin, Total: 3.1 g/dL (ref 1.5–4.5)
Glucose: 80 mg/dL (ref 70–99)
Potassium: 4 mmol/L (ref 3.5–5.2)
Sodium: 137 mmol/L (ref 134–144)
Total Protein: 7.7 g/dL (ref 6.0–8.5)
eGFR: 93 mL/min/{1.73_m2} (ref >59)

## 2022-09-11 LAB — HEMOGLOBIN A1C
Est. average glucose Bld gHb Est-mCnc: 117 mg/dL
Hgb A1c MFr Bld: 5.7 % — ABNORMAL HIGH (ref 4.8–5.6)

## 2022-09-11 LAB — CBC
Hematocrit: 37.4 % (ref 34.0–46.6)
Hemoglobin: 12.2 g/dL (ref 11.1–15.9)
MCH: 28.7 pg (ref 26.6–33.0)
MCHC: 32.6 g/dL (ref 31.5–35.7)
MCV: 88 fL (ref 79–97)
Platelets: 240 10*3/uL (ref 150–450)
RBC: 4.25 x10E6/uL (ref 3.77–5.28)
RDW: 13.8 % (ref 11.7–15.4)
WBC: 8 10*3/uL (ref 3.4–10.8)

## 2022-09-11 LAB — HEPATITIS C ANTIBODY: Hep C Virus Ab: NONREACTIVE

## 2022-09-11 LAB — MICROALBUMIN / CREATININE URINE RATIO
Creatinine, Urine: 39.4 mg/dL
Microalb/Creat Ratio: <8 mg/g creat (ref 0–29)
Microalbumin, Urine: <3 ug/mL

## 2022-10-29 ENCOUNTER — Ambulatory Visit: Payer: Self-pay | Admitting: Nurse Practitioner

## 2022-11-11 ENCOUNTER — Other Ambulatory Visit: Payer: Self-pay | Admitting: Nurse Practitioner

## 2022-11-11 ENCOUNTER — Telehealth: Payer: Self-pay

## 2022-11-11 MED ORDER — HYDROCHLOROTHIAZIDE 25 MG PO TABS: ORAL_TABLET | ORAL | 1 refills | Status: DC

## 2022-11-11 NOTE — Telephone Encounter (Signed)
Rx sent 

## 2022-11-11 NOTE — Telephone Encounter (Signed)
Patient requesting refill on HCTZ, we have never sent this in. Please confirm ok to refill.  Thanks!

## 2023-01-04 ENCOUNTER — Ambulatory Visit: Payer: Self-pay | Admitting: Internal Medicine

## 2023-01-11 ENCOUNTER — Encounter: Payer: Self-pay | Admitting: Nurse Practitioner

## 2023-01-11 ENCOUNTER — Ambulatory Visit (INDEPENDENT_AMBULATORY_CARE_PROVIDER_SITE_OTHER): Payer: 59 | Admitting: Nurse Practitioner

## 2023-01-11 ENCOUNTER — Other Ambulatory Visit: Payer: Self-pay | Admitting: Nurse Practitioner

## 2023-01-11 VITALS — BP 120/88 | HR 78 | Temp 98.5°F | Ht 64.0 in | Wt 171.2 lb

## 2023-01-11 DIAGNOSIS — Z79899 Other long term (current) drug therapy: Secondary | ICD-10-CM | POA: Diagnosis not present

## 2023-01-11 DIAGNOSIS — Z13228 Encounter for screening for other metabolic disorders: Secondary | ICD-10-CM

## 2023-01-11 DIAGNOSIS — Z13220 Encounter for screening for lipoid disorders: Secondary | ICD-10-CM | POA: Diagnosis not present

## 2023-01-11 DIAGNOSIS — R5383 Other fatigue: Secondary | ICD-10-CM

## 2023-01-11 DIAGNOSIS — I10 Essential (primary) hypertension: Secondary | ICD-10-CM | POA: Insufficient documentation

## 2023-01-11 DIAGNOSIS — Z8249 Family history of ischemic heart disease and other diseases of the circulatory system: Secondary | ICD-10-CM

## 2023-01-11 DIAGNOSIS — Z2821 Immunization not carried out because of patient refusal: Secondary | ICD-10-CM

## 2023-01-11 DIAGNOSIS — E663 Overweight: Secondary | ICD-10-CM

## 2023-01-11 DIAGNOSIS — Z8279 Family history of other congenital malformations, deformations and chromosomal abnormalities: Secondary | ICD-10-CM

## 2023-01-11 DIAGNOSIS — Z0000 Encounter for general adult medical examination without abnormal findings: Secondary | ICD-10-CM | POA: Diagnosis not present

## 2023-01-11 LAB — POCT URINALYSIS DIP (CLINITEK)
Bilirubin, UA: NEGATIVE
Blood, UA: NEGATIVE
Glucose, UA: NEGATIVE mg/dL
Ketones, POC UA: NEGATIVE mg/dL
Leukocytes, UA: NEGATIVE
Nitrite, UA: NEGATIVE
POC PROTEIN,UA: NEGATIVE
Spec Grav, UA: 1.02 (ref 1.010–1.025)
Urobilinogen, UA: 0.2 E.U./dL
pH, UA: 7.5 (ref 5.0–8.0)

## 2023-01-11 NOTE — Patient Instructions (Signed)

## 2023-01-11 NOTE — Progress Notes (Addendum)
Holly Estrada,acting as a Neurosurgeon for Holly Felts, FNP.,have documented all relevant documentation on the behalf of Holly Felts, FNP,as directed by  Holly Felts, FNP while in the presence of Holly Felts, FNP.   Subjective:     Patient ID: Holly Estrada , female    DOB: July 16, 1991 , 32 y.o.   MRN: [id number]   Chief Complaint  Patient presents with   Annual Exam    HPI  Patient presents today for HM, patient states compliance with medications and has no other concerns today.  Wt Readings from Last 3 Encounters: 01/11/23 : 171 lb 3.2 oz (77.7 kg) 09/10/22 : 168 lb (76.2 kg) 02/13/21 : 168 lb 8 oz (76.4 kg)  She had air fried fish and vegetable medley. Grilled chicken with salad. She takes her medications at 7am.   She had guiliam barre she thinks after flu vaccine. She was hospitalized for total of 3 weeks.   Her father had 2 heart valves and concerned about if hereditary   She was supposed to have an Echo done when she was pregnant.      Past Medical History:  Diagnosis Date   History of Guillain-Barre syndrome 2013   02-10-2021  per pt no residual   History of pregnancy induced hypertension 2017   postpartum preeclampsia   Hypertension 2017   PONV (postoperative nausea and vomiting)    Umbilical hernia      Family History  Problem Relation Age of Onset   Hypertension Mother    Healthy Father    Hypertension Father    Cancer Maternal Grandmother    Kidney failure Maternal Grandfather      Current Outpatient Medications:    cetirizine (ZYRTEC) 10 MG tablet, Take 10 mg by mouth at bedtime., Disp: , Rfl:    hydrochlorothiazide (HYDRODIURIL) 25 MG tablet, Take 1/2 tablet po daily, Disp: 45 tablet, Rfl: 1   Probiotic Product (PROBIOTIC DAILY PO), Take by mouth at bedtime., Disp: , Rfl:    Allergies  Allergen Reactions   Fluogen [Influenza Virus Vaccine]     guilliam barre      The patient states she uses tubal ligation for birth control.  No LMP  recorded.. Negative for Dysmenorrhea and Negative for Menorrhagia.  Negative for: breast discharge, breast lump(s), breast pain and breast self exam. Associated symptoms include abnormal vaginal bleeding. Pertinent negatives include abnormal bleeding (hematology), anxiety, decreased libido, depression, difficulty falling sleep, dyspareunia, history of infertility, nocturia, sexual dysfunction, sleep disturbances, urinary incontinence, urinary urgency, vaginal discharge and vaginal itching. Diet regular - eating fruits and vegetables and fish and chicken; she has not had any sweets in 2 weeks. The patient states her exercise level is daily for 45 minutes - one hour and walks in the evening for 30 minutes.   The patient's tobacco use is:  Social History   Tobacco Use  Smoking Status Never  Smokeless Tobacco Never   She has been exposed to passive smoke. The patient's alcohol use is:  Social History   Substance and Sexual Activity  Alcohol Use No   Additional information: Last pap 01/06/2021, next one scheduled for 01/07/2024.    Review of Systems  Constitutional:  Positive for fatigue.  HENT: Negative.    Eyes: Negative.   Respiratory: Negative.    Cardiovascular: Negative.   Gastrointestinal: Negative.   Endocrine: Negative.   Genitourinary: Negative.   Musculoskeletal: Negative.   Skin: Negative.   Allergic/Immunologic: Negative.   Neurological: Negative.   Hematological: Negative.  Psychiatric/Behavioral: Negative.       Today's Vitals   01/11/23 1537  BP: 120/88  Pulse: 78  Temp: 98.5 F (36.9 C)  TempSrc: Oral  Weight: 171 lb 3.2 oz (77.7 kg)  Height: 5\' 4"  (1.626 m)  PainSc: 0-No pain   Body mass index is 29.39 kg/m.   Objective:  Physical Exam Vitals reviewed.  Constitutional:      General: She is not in acute distress.    Appearance: Normal appearance. She is well-developed. She is obese.  HENT:     Head: Normocephalic and atraumatic.     Right Ear:  Hearing, tympanic membrane, ear canal and external ear normal. There is no impacted cerumen.     Left Ear: Hearing, tympanic membrane, ear canal and external ear normal. There is no impacted cerumen.     Nose: Nose normal.     Mouth/Throat:     Mouth: Mucous membranes are moist.  Eyes:     General: Lids are normal.     Extraocular Movements: Extraocular movements intact.     Conjunctiva/sclera: Conjunctivae normal.     Pupils: Pupils are equal, round, and reactive to light.     Funduscopic exam:    Right eye: No papilledema.        Left eye: No papilledema.  Neck:     Thyroid: No thyroid mass.     Vascular: No carotid bruit.  Cardiovascular:     Rate and Rhythm: Normal rate and regular rhythm.     Pulses: Normal pulses.     Heart sounds: Normal heart sounds. No murmur heard. Pulmonary:     Effort: Pulmonary effort is normal. No respiratory distress.     Breath sounds: Normal breath sounds. No wheezing.  Chest:     Chest wall: No mass.  Breasts:    Tanner Score is 5.     Right: Normal. No mass or tenderness.     Left: Normal. No mass or tenderness.  Abdominal:     General: Abdomen is flat. Bowel sounds are normal. There is no distension.     Palpations: Abdomen is soft.     Tenderness: There is no abdominal tenderness.  Genitourinary:    Rectum: Guaiac result negative.  Musculoskeletal:        General: No swelling. Normal range of motion.     Cervical back: Full passive range of motion without pain, normal range of motion and neck supple.     Right lower leg: No edema.     Left lower leg: No edema.  Lymphadenopathy:     Upper Body:     Right upper body: No supraclavicular, axillary or pectoral adenopathy.     Left upper body: No supraclavicular, axillary or pectoral adenopathy.  Skin:    General: Skin is warm and dry.     Capillary Refill: Capillary refill takes less than 2 seconds.  Neurological:     General: No focal deficit present.     Mental Status: She is alert  and oriented to person, place, and time.     Cranial Nerves: No cranial nerve deficit.     Sensory: No sensory deficit.  Psychiatric:        Mood and Affect: Mood normal.        Behavior: Behavior normal.        Thought Content: Thought content normal.        Judgment: Judgment normal.         Assessment And Plan:  1. Encounter for annual physical exam Behavior modifications discussed and diet history reviewed.   Pt will continue to exercise regularly and modify diet with low GI, plant based foods and decrease intake of processed foods.  Recommend intake of daily multivitamin, Vitamin D, and calcium.  Recommend self breast exams for preventive screenings, as well as recommend immunizations that include influenza, TDA  2. COVID-19 vaccination declined Declines covid 19 vaccine. Discussed risk of covid 9 and if she changes her mind about the vaccine to call the office. Education has been provided regarding the importance of this vaccine but patient still declined. Advised may receive this vaccine at local pharmacy or Health Dept.or vaccine clinic. Aware to provide a copy of the vaccination record if obtained from local pharmacy or Health Dept.  Encouraged to take multivitamin, vitamin d, vitamin c and zinc to increase immune system. Aware can call office if would like to have vaccine here at office. Verbalized acceptance and understanding.  3. Encounter for screening for metabolic disorder - Hemoglobin A1c  4. Encounter for screening for lipid disorder - Lipid panel  5. Family history of congenital heart defect  6. Family history of heart murmur  7. Essential hypertension Blood pressure is fairly controlled, diastolic is slightly elevated, encouraged to stay well hydrated with water.  EKG done with NSR HR 77 - EKG 12-Lead - POCT URINALYSIS DIP (CLINITEK) - Microalbumin / creatinine urine ratio - CMP14+EGFR  8. Other fatigue Will check for metabolic causes. Will also order  an ECHO due to family history of heart defects. She may need a sleep study pending labs and ECHO - Vitamin D (25 hydroxy) - TSH - Vitamin B12 - Iron, TIBC and Ferritin Panel - ECHOCARDIOGRAM COMPLETE; Future  9. Other Estrada term (current) drug therapy - CBC with Differential/Platelet  10. Overweight (BMI 25.0-29.9) Continue focusing on healthy diet and regular exercise.    Return for 1 year physical, 6 month bp check. Patient was given opportunity to ask questions. Patient verbalized understanding of the plan and was able to repeat key elements of the plan. All questions were answered to their satisfaction.   Holly Felts, FNP   I, Holly Felts, FNP, have reviewed all documentation for this visit. The documentation on 01/11/23 for the exam, diagnosis, procedures, and orders are all accurate and complete.   THE PATIENT IS ENCOURAGED TO PRACTICE SOCIAL DISTANCING DUE TO THE COVID-19 PANDEMIC.

## 2023-01-12 LAB — CBC WITH DIFFERENTIAL/PLATELET
Basophils Absolute: 0.1 10*3/uL (ref 0.0–0.2)
Basos: 1 %
EOS (ABSOLUTE): 0.2 10*3/uL (ref 0.0–0.4)
Eos: 2 %
Hematocrit: 38.1 % (ref 34.0–46.6)
Hemoglobin: 12.1 g/dL (ref 11.1–15.9)
Immature Grans (Abs): 0 10*3/uL (ref 0.0–0.1)
Immature Granulocytes: 0 %
Lymphocytes Absolute: 2.1 10*3/uL (ref 0.7–3.1)
Lymphs: 27 %
MCH: 27.9 pg (ref 26.6–33.0)
MCHC: 31.8 g/dL (ref 31.5–35.7)
MCV: 88 fL (ref 79–97)
Monocytes Absolute: 0.4 10*3/uL (ref 0.1–0.9)
Monocytes: 6 %
Neutrophils Absolute: 5 10*3/uL (ref 1.4–7.0)
Neutrophils: 64 %
Platelets: 255 10*3/uL (ref 150–450)
RBC: 4.33 x10E6/uL (ref 3.77–5.28)
RDW: 14.1 % (ref 11.7–15.4)
WBC: 7.8 10*3/uL (ref 3.4–10.8)

## 2023-01-12 LAB — CMP14+EGFR
ALT: 19 IU/L (ref 0–32)
AST: 22 IU/L (ref 0–40)
Albumin/Globulin Ratio: 1.5 (ref 1.2–2.2)
Albumin: 4.4 g/dL (ref 3.9–4.9)
Alkaline Phosphatase: 56 IU/L (ref 44–121)
BUN/Creatinine Ratio: 15 (ref 9–23)
BUN: 13 mg/dL (ref 6–20)
Bilirubin Total: <0.2 mg/dL (ref 0.0–1.2)
CO2: 22 mmol/L (ref 20–29)
Calcium: 9.1 mg/dL (ref 8.7–10.2)
Chloride: 104 mmol/L (ref 96–106)
Creatinine, Ser: 0.86 mg/dL (ref 0.57–1.00)
Globulin, Total: 2.9 g/dL (ref 1.5–4.5)
Glucose: 91 mg/dL (ref 70–99)
Potassium: 4.4 mmol/L (ref 3.5–5.2)
Sodium: 138 mmol/L (ref 134–144)
Total Protein: 7.3 g/dL (ref 6.0–8.5)
eGFR: 92 mL/min/{1.73_m2} (ref >59)

## 2023-01-12 LAB — IRON,TIBC AND FERRITIN PANEL
Ferritin: 9 ng/mL — ABNORMAL LOW (ref 15–150)
Iron Saturation: 8 % — CL (ref 15–55)
Iron: 31 ug/dL (ref 27–159)
Total Iron Binding Capacity: 396 ug/dL (ref 250–450)
UIBC: 365 ug/dL (ref 131–425)

## 2023-01-12 LAB — LIPID PANEL
Chol/HDL Ratio: 2.8 ratio (ref 0.0–4.4)
Cholesterol, Total: 145 mg/dL (ref 100–199)
HDL: 52 mg/dL (ref >39)
LDL Chol Calc (NIH): 82 mg/dL (ref 0–99)
Triglycerides: 53 mg/dL (ref 0–149)
VLDL Cholesterol Cal: 11 mg/dL (ref 5–40)

## 2023-01-12 LAB — HEMOGLOBIN A1C
Est. average glucose Bld gHb Est-mCnc: 120 mg/dL
Hgb A1c MFr Bld: 5.8 % — ABNORMAL HIGH (ref 4.8–5.6)

## 2023-01-12 LAB — MICROALBUMIN / CREATININE URINE RATIO
Creatinine, Urine: 146.2 mg/dL
Microalb/Creat Ratio: 3 mg/g creat (ref 0–29)
Microalbumin, Urine: 3.9 ug/mL

## 2023-01-12 LAB — TSH: TSH: 1.41 u[IU]/mL (ref 0.450–4.500)

## 2023-01-12 LAB — VITAMIN D 25 HYDROXY (VIT D DEFICIENCY, FRACTURES): Vit D, 25-Hydroxy: 34.2 ng/mL (ref 30.0–100.0)

## 2023-01-12 LAB — VITAMIN B12: Vitamin B-12: 753 pg/mL (ref 232–1245)

## 2023-02-01 ENCOUNTER — Other Ambulatory Visit: Payer: Self-pay

## 2023-02-01 DIAGNOSIS — D649 Anemia, unspecified: Secondary | ICD-10-CM

## 2023-02-01 MED ORDER — FUSION PLUS PO CAPS
1.0000 | ORAL_CAPSULE | Freq: Every day | ORAL | 2 refills | Status: AC
Start: 2023-02-01 — End: ?

## 2023-02-16 ENCOUNTER — Ambulatory Visit (HOSPITAL_COMMUNITY): Payer: 59 | Attending: Cardiovascular Disease

## 2023-02-16 DIAGNOSIS — R5383 Other fatigue: Secondary | ICD-10-CM | POA: Insufficient documentation

## 2023-02-16 DIAGNOSIS — R008 Other abnormalities of heart beat: Secondary | ICD-10-CM

## 2023-02-16 DIAGNOSIS — I088 Other rheumatic multiple valve diseases: Secondary | ICD-10-CM | POA: Diagnosis not present

## 2023-02-16 LAB — ECHOCARDIOGRAM COMPLETE
Area-P 1/2: 4.3 cm2
S' Lateral: 2.8 cm

## 2023-05-12 ENCOUNTER — Other Ambulatory Visit: Payer: Self-pay | Admitting: Nurse Practitioner

## 2023-07-13 NOTE — Progress Notes (Signed)
Holly Estrada, CMA,acting as a Neurosurgeon for Holly Felts, FNP.,have documented all relevant documentation on the behalf of Holly Felts, FNP,as directed by  Holly Felts, FNP while in the presence of Holly Felts, FNP.  Subjective:  Patient ID: Holly Estrada , female    DOB: [birth year] , 32 y.o.   MRN: [id number]  Chief Complaint  Patient presents with   Hypertension    HPI  Patient presents today for a bp follow up, Patient reports compliance with medication. Patient denies any chest pain, SOB, or headaches. Patient reports she would like to discuss her hormones and her gut health.   Patient reports she feels like her hormones are "out of wack". Patient reports her sex drive is very low now, she reports after she had her 2 kids. Patient reports she has a 94 year old and a 32 year old. 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 She goes to church, bible study and prayer. It is difficult for her to get in the mood to have sex with her husband. Denies any stressors related to her relationship. Before she had her first daughter she did not have any issues.      Past Medical History:  Diagnosis Date   History of Guillain-Barre syndrome 2013   02-10-2021  per pt no residual   History of pregnancy induced hypertension 2017   postpartum preeclampsia   Hypertension 2017   PONV (postoperative nausea and vomiting)    Umbilical hernia      Family History  Problem Relation Age of Onset   Hypertension Mother    Healthy Father    Hypertension Father    Cancer Maternal Grandmother    Kidney failure Maternal Grandfather      Current Outpatient Medications:    amLODipine (NORVASC) 2.5 MG tablet, Take 1 tablet (2.5 mg total) by mouth daily., Disp: 30 tablet, Rfl: 11   cetirizine (ZYRTEC) 10 MG tablet, Take 10 mg by mouth at bedtime., Disp: , Rfl:    Iron-FA-B Cmp-C-Biot-Probiotic (FUSION PLUS) CAPS, Take 1 capsule by mouth daily at 6 (six) AM., Disp: 30 capsule, Rfl: 2   Probiotic Product (PROBIOTIC DAILY  PO), Take by mouth at bedtime., Disp: , Rfl:    Allergies  Allergen Reactions   Fluogen [Influenza Virus Vaccine]     guilliam barre     Review of Systems  Constitutional:  Positive for fatigue.  HENT: Negative.    Eyes: Negative.   Respiratory: Negative.    Cardiovascular: Negative.   Gastrointestinal: Negative.   Neurological: Negative.   Psychiatric/Behavioral: Negative.       Today's Vitals   07/14/23 1539  BP: 110/70  Pulse: 85  Temp: 98.5 F (36.9 C)  TempSrc: Oral  Weight: 170 lb (77.1 kg)  Height: 5\' 4"  (1.626 m)  PainSc: 0-No pain   Body mass index is 29.18 kg/m.  Wt Readings from Last 3 Encounters:  07/14/23 170 lb (77.1 kg)  01/11/23 171 lb 3.2 oz (77.7 kg)  09/10/22 168 lb (76.2 kg)     Objective:  Physical Exam Vitals reviewed.  Constitutional:      General: She is not in acute distress.    Appearance: Normal appearance.  Cardiovascular:     Rate and Rhythm: Normal rate and regular rhythm.     Pulses: Normal pulses.     Heart sounds: Normal heart sounds. No murmur heard. Pulmonary:     Effort: Pulmonary effort is normal. No respiratory distress.     Breath sounds: Normal breath  sounds. No wheezing.  Neurological:     General: No focal deficit present.     Mental Status: She is alert and oriented to person, place, and time.     Cranial Nerves: No cranial nerve deficit.     Motor: No weakness.  Psychiatric:        Mood and Affect: Mood normal.        Behavior: Behavior normal.        Thought Content: Thought content normal.        Judgment: Judgment normal.     Assessment And Plan:  Essential hypertension Assessment & Plan: She was on hydrochlorothiazide I will change her to amlodipine as this can sometimes cause libido issues. She is to f/u for nurse visit in 2 weeks.   Orders: -     Basic metabolic panel -     amLODIPine Besylate; Take 1 tablet (2.5 mg total) by mouth daily.  Dispense: 30 tablet; Refill: 11  Iron deficiency anemia  secondary to inadequate dietary iron intake Assessment & Plan: Had been taking iron supplement will recheck today  Orders: -     Iron, TIBC and Ferritin Panel  Low libido Assessment & Plan: Check thyroid at last visit. Will send for a sleep study as well due to her having fatigue.  Orders: -     CBC with Differential/Platelet -     Ambulatory referral to Sleep Studies  Other fatigue Assessment & Plan: Will check for metabolic causes.   Orders: -     Ambulatory referral to Sleep Studies  Overweight (BMI 25.0-29.9)    Return for 6 month bp check.  Patient was given opportunity to ask questions. Patient verbalized understanding of the plan and was able to repeat key elements of the plan. All questions were answered to their satisfaction.    Jeanell Sparrow, FNP, have reviewed all documentation for this visit. The documentation on 07/19/23 for the exam, diagnosis, procedures, and orders are all accurate and complete.   IF YOU HAVE BEEN REFERRED TO A SPECIALIST, IT MAY TAKE 1-2 WEEKS TO SCHEDULE/PROCESS THE REFERRAL. IF YOU HAVE NOT HEARD FROM US/SPECIALIST IN TWO WEEKS, PLEASE GIVE [place] A CALL AT [phone].

## 2023-07-14 ENCOUNTER — Encounter: Payer: Self-pay | Admitting: Nurse Practitioner

## 2023-07-14 ENCOUNTER — Ambulatory Visit (INDEPENDENT_AMBULATORY_CARE_PROVIDER_SITE_OTHER): Payer: 59 | Admitting: Nurse Practitioner

## 2023-07-14 VITALS — BP 110/70 | HR 85 | Temp 98.5°F | Ht 64.0 in | Wt 170.0 lb

## 2023-07-14 DIAGNOSIS — I10 Essential (primary) hypertension: Secondary | ICD-10-CM

## 2023-07-14 DIAGNOSIS — D508 Other iron deficiency anemias: Secondary | ICD-10-CM | POA: Diagnosis not present

## 2023-07-14 DIAGNOSIS — R6882 Decreased libido: Secondary | ICD-10-CM

## 2023-07-14 DIAGNOSIS — R5383 Other fatigue: Secondary | ICD-10-CM | POA: Diagnosis not present

## 2023-07-14 DIAGNOSIS — E663 Overweight: Secondary | ICD-10-CM

## 2023-07-14 MED ORDER — AMLODIPINE BESYLATE 2.5 MG PO TABS
2.5000 mg | ORAL_TABLET | Freq: Every day | ORAL | 11 refills | Status: DC
Start: 2023-07-14 — End: 2024-05-17

## 2023-07-15 LAB — CBC WITH DIFFERENTIAL/PLATELET
Basophils Absolute: 0.1 10*3/uL (ref 0.0–0.2)
Basos: 1 %
EOS (ABSOLUTE): 0.1 10*3/uL (ref 0.0–0.4)
Eos: 2 %
Hematocrit: 39.4 % (ref 34.0–46.6)
Hemoglobin: 12.6 g/dL (ref 11.1–15.9)
Immature Grans (Abs): 0 10*3/uL (ref 0.0–0.1)
Immature Granulocytes: 0 %
Lymphocytes Absolute: 2.1 10*3/uL (ref 0.7–3.1)
Lymphs: 28 %
MCH: 29.7 pg (ref 26.6–33.0)
MCHC: 32 g/dL (ref 31.5–35.7)
MCV: 93 fL (ref 79–97)
Monocytes Absolute: 0.4 10*3/uL (ref 0.1–0.9)
Monocytes: 6 %
Neutrophils Absolute: 4.6 10*3/uL (ref 1.4–7.0)
Neutrophils: 63 %
Platelets: 223 10*3/uL (ref 150–450)
RBC: 4.24 x10E6/uL (ref 3.77–5.28)
RDW: 13.7 % (ref 11.7–15.4)
WBC: 7.3 10*3/uL (ref 3.4–10.8)

## 2023-07-15 LAB — IRON,TIBC AND FERRITIN PANEL
Ferritin: 22 ng/mL (ref 15–150)
Iron Saturation: 63 % — ABNORMAL HIGH (ref 15–55)
Iron: 209 ug/dL — ABNORMAL HIGH (ref 27–159)
Total Iron Binding Capacity: 331 ug/dL (ref 250–450)
UIBC: 122 ug/dL — ABNORMAL LOW (ref 131–425)

## 2023-07-15 LAB — BASIC METABOLIC PANEL
BUN/Creatinine Ratio: 19 (ref 9–23)
BUN: 19 mg/dL (ref 6–20)
CO2: 24 mmol/L (ref 20–29)
Calcium: 9.8 mg/dL (ref 8.7–10.2)
Chloride: 101 mmol/L (ref 96–106)
Creatinine, Ser: 0.99 mg/dL (ref 0.57–1.00)
Glucose: 88 mg/dL (ref 70–99)
Potassium: 4 mmol/L (ref 3.5–5.2)
Sodium: 138 mmol/L (ref 134–144)
eGFR: 78 mL/min/{1.73_m2} (ref >59)

## 2023-07-19 NOTE — Assessment & Plan Note (Signed)
Had been taking iron supplement will recheck today

## 2023-07-19 NOTE — Assessment & Plan Note (Signed)
Will check for metabolic causes.

## 2023-07-19 NOTE — Assessment & Plan Note (Signed)
Check thyroid at last visit. Will send for a sleep study as well due to her having fatigue.

## 2023-07-19 NOTE — Assessment & Plan Note (Signed)
She was on hydrochlorothiazide I will change her to amlodipine as this can sometimes cause libido issues. She is to f/u for nurse visit in 2 weeks.

## 2023-09-20 ENCOUNTER — Ambulatory Visit: Payer: 59 | Admitting: Nurse Practitioner

## 2023-09-20 ENCOUNTER — Encounter: Payer: Self-pay | Admitting: Nurse Practitioner

## 2023-09-20 VITALS — BP 118/64 | HR 75 | Temp 98.3°F | Ht 64.0 in | Wt 178.2 lb

## 2023-09-20 DIAGNOSIS — E6609 Other obesity due to excess calories: Secondary | ICD-10-CM | POA: Diagnosis not present

## 2023-09-20 DIAGNOSIS — E66811 Obesity, class 1: Secondary | ICD-10-CM | POA: Insufficient documentation

## 2023-09-20 DIAGNOSIS — Z6830 Body mass index (BMI) 30.0-30.9, adult: Secondary | ICD-10-CM

## 2023-09-20 DIAGNOSIS — L509 Urticaria, unspecified: Secondary | ICD-10-CM | POA: Diagnosis not present

## 2023-09-20 DIAGNOSIS — Z2821 Immunization not carried out because of patient refusal: Secondary | ICD-10-CM | POA: Insufficient documentation

## 2023-09-20 NOTE — Assessment & Plan Note (Signed)

## 2023-09-20 NOTE — Assessment & Plan Note (Signed)
She is encouraged to strive for BMI less than 30 to decrease cardiac risk. Advised to aim for at least 150 minutes of exercise per week.

## 2023-09-20 NOTE — Progress Notes (Signed)
Holly Estrada, CMA,acting as a Neurosurgeon for Holly Felts, FNP.,have documented all relevant documentation on the behalf of Holly Felts, FNP,as directed by  Holly Felts, FNP while in the presence of Holly Felts, FNP.  Subjective:  Patient ID: Holly Estrada , female    DOB: 10-23-1990 , 33 y.o.   MRN: [id number]  Chief Complaint  Patient presents with   Urticaria    HPI  Patient presents today for hives all over her body. Patient reports her hives starting 3 weeks ago, she reports on and off since the summer. She reports she ate a chocolate donut and the hives started she reports they got worse over the 3 weeks. She reports they are itchy and warm to the touch. The hives have now gone away. She reports taking Zyrtec and it helped. Denies shortness of breath, tongue or lip swelling. She has itching when it occurs. May last for 5 hours.      Past Medical History:  Diagnosis Date   History of Guillain-Barre syndrome 2013   02-10-2021  per pt no residual   History of pregnancy induced hypertension 2017   postpartum preeclampsia   Hypertension 2017   PONV (postoperative nausea and vomiting)    Umbilical hernia      Family History  Problem Relation Age of Onset   Hypertension Mother    Healthy Father    Hypertension Father    Cancer Maternal Grandmother    Kidney failure Maternal Grandfather      Current Outpatient Medications:    amLODipine (NORVASC) 2.5 MG tablet, Take 1 tablet (2.5 mg total) by mouth daily., Disp: 30 tablet, Rfl: 11   cetirizine (ZYRTEC) 10 MG tablet, Take 10 mg by mouth at bedtime., Disp: , Rfl:    Iron-FA-B Cmp-C-Biot-Probiotic (FUSION PLUS) CAPS, Take 1 capsule by mouth daily at 6 (six) AM., Disp: 30 capsule, Rfl: 2   Probiotic Product (PROBIOTIC DAILY PO), Take by mouth at bedtime., Disp: , Rfl:    Allergies  Allergen Reactions   Fluogen [Influenza Virus Vaccine]     guilliam barre     Review of Systems  Constitutional: Negative.   HENT: Negative.     Eyes: Negative.   Respiratory: Negative.    Cardiovascular: Negative.   Gastrointestinal: Negative.   Skin:  Positive for rash (no rash presently).  Neurological: Negative.   Psychiatric/Behavioral: Negative.       Today's Vitals   09/20/23 1647  BP: 118/64  Pulse: 75  Temp: 98.3 F (36.8 C)  TempSrc: Oral  Weight: 178 lb 3.2 oz (80.8 kg)  Height: 5\' 4"  (1.626 m)  PainSc: 0-No pain   Body mass index is 30.59 kg/m.  Wt Readings from Last 3 Encounters:  09/20/23 178 lb 3.2 oz (80.8 kg)  07/14/23 170 lb (77.1 kg)  01/11/23 171 lb 3.2 oz (77.7 kg)      Objective:  Physical Exam Vitals reviewed.  Constitutional:      General: She is not in acute distress.    Appearance: Normal appearance. She is obese.  Cardiovascular:     Rate and Rhythm: Normal rate and regular rhythm.     Pulses: Normal pulses.     Heart sounds: Normal heart sounds. No murmur heard. Pulmonary:     Effort: Pulmonary effort is normal. No respiratory distress.     Breath sounds: Normal breath sounds. No wheezing.  Neurological:     General: No focal deficit present.     Mental Status: She is alert  and oriented to person, place, and time.     Cranial Nerves: No cranial nerve deficit.     Motor: No weakness.  Psychiatric:        Mood and Affect: Mood normal.        Behavior: Behavior normal.        Thought Content: Thought content normal.        Judgment: Judgment normal.         Assessment And Plan:  Hives Assessment & Plan: No hives presently however her pictures shows raised vesicles in patches and larger raised hives to upper inner arm. Will refer to allergist.   Orders: -     Ambulatory referral to Allergy  Influenza vaccination declined Assessment & Plan: Patient declined influenza vaccination at this time. Patient is aware that influenza vaccine prevents illness in 70% of healthy people, and reduces hospitalizations to 30-70% in elderly. This vaccine is recommended annually.  Education has been provided regarding the importance of this vaccine but patient still declined. Advised may receive this vaccine at local pharmacy or Health Dept.or vaccine clinic. Aware to provide a copy of the vaccination record if obtained from local pharmacy or Health Dept.  Pt is willing to accept risk associated with refusing vaccination.    COVID-19 vaccination declined Assessment & Plan: Declines covid 19 vaccine. Discussed risk of covid 66 and if she changes her mind about the vaccine to call the office. Education has been provided regarding the importance of this vaccine but patient still declined. Advised may receive this vaccine at local pharmacy or Health Dept.or vaccine clinic. Aware to provide a copy of the vaccination record if obtained from local pharmacy or Health Dept.  Encouraged to take multivitamin, vitamin d, vitamin c and zinc to increase immune system. Aware can call office if would like to have vaccine here at office. Verbalized acceptance and understanding.    Class 1 obesity due to excess calories with body mass index (BMI) of 30.0 to 30.9 in adult, unspecified whether serious comorbidity present Assessment & Plan: She is encouraged to strive for BMI less than 30 to decrease cardiac risk. Advised to aim for at least 150 minutes of exercise per week.      Return for keep same next.  Patient was given opportunity to ask questions. Patient verbalized understanding of the plan and was able to repeat key elements of the plan. All questions were answered to their satisfaction.    Jeanell Sparrow, FNP, have reviewed all documentation for this visit. The documentation on 09/20/23 for the exam, diagnosis, procedures, and orders are all accurate and complete.   IF YOU HAVE BEEN REFERRED TO A SPECIALIST, IT MAY TAKE 1-2 WEEKS TO SCHEDULE/PROCESS THE REFERRAL. IF YOU HAVE NOT HEARD FROM US/SPECIALIST IN TWO WEEKS, PLEASE GIVE [place] A CALL AT [phone].

## 2023-09-20 NOTE — Assessment & Plan Note (Signed)
No hives presently however her pictures shows raised vesicles in patches and larger raised hives to upper inner arm. Will refer to allergist.

## 2023-09-20 NOTE — Assessment & Plan Note (Signed)

## 2024-01-18 ENCOUNTER — Encounter: Payer: Self-pay | Admitting: Nurse Practitioner

## 2024-01-18 ENCOUNTER — Ambulatory Visit: Payer: 59 | Admitting: Nurse Practitioner

## 2024-01-18 VITALS — BP 110/70 | HR 84 | Temp 98.4°F | Ht 64.0 in | Wt 190.6 lb

## 2024-01-18 DIAGNOSIS — Z0000 Encounter for general adult medical examination without abnormal findings: Secondary | ICD-10-CM | POA: Diagnosis not present

## 2024-01-18 DIAGNOSIS — D508 Other iron deficiency anemias: Secondary | ICD-10-CM

## 2024-01-18 DIAGNOSIS — Z2821 Immunization not carried out because of patient refusal: Secondary | ICD-10-CM

## 2024-01-18 DIAGNOSIS — Z13220 Encounter for screening for lipoid disorders: Secondary | ICD-10-CM

## 2024-01-18 DIAGNOSIS — R7309 Other abnormal glucose: Secondary | ICD-10-CM | POA: Diagnosis not present

## 2024-01-18 DIAGNOSIS — I10 Essential (primary) hypertension: Secondary | ICD-10-CM

## 2024-01-18 DIAGNOSIS — Z79899 Other long term (current) drug therapy: Secondary | ICD-10-CM

## 2024-01-18 LAB — POCT URINALYSIS DIP (CLINITEK)
Bilirubin, UA: NEGATIVE
Blood, UA: NEGATIVE
Glucose, UA: NEGATIVE mg/dL
Ketones, POC UA: NEGATIVE mg/dL
Leukocytes, UA: NEGATIVE
Nitrite, UA: NEGATIVE
POC PROTEIN,UA: NEGATIVE
Spec Grav, UA: >=1.03 — AB (ref 1.010–1.025)
Urobilinogen, UA: 0.2 U/dL
pH, UA: 6.5 (ref 5.0–8.0)

## 2024-01-18 NOTE — Progress Notes (Unsigned)
 [place], CMA,acting as a Neurosurgeon for Susanna Epley, FNP.,have documented all relevant documentation on the behalf of Susanna Epley, FNP,as directed by  Susanna Epley, FNP while in the presence of Susanna Epley, FNP.  Subjective:    Patient ID: Holly Estrada , female    DOB: 1990-12-09 , 33 y.o.   MRN: [id number]  Chief Complaint  Patient presents with   Annual Exam    Patient presents today for HM, Patient reports compliance with medication. Patient denies any chest pain, SOB, or headaches. Patient has no concerns today.     HPI  She has seen the podiatrist - she may need surgery on her bunions - time out for walking is 2 months and total time 4 months. Right foot pain when walking with heels. 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 She goes to [facility].      Past Medical History:  Diagnosis Date   History of Guillain-Barre syndrome 2013   02-10-2021  per pt no residual   History of pregnancy induced hypertension 2017   postpartum preeclampsia   Hypertension 2017   PONV (postoperative nausea and vomiting)    Umbilical hernia      Family History  Problem Relation Age of Onset   Hypertension Mother    Healthy Father    Hypertension Father    Cancer Maternal Grandmother    Kidney failure Maternal Grandfather      Current Outpatient Medications:    amLODipine  (NORVASC ) 2.5 MG tablet, Take 1 tablet (2.5 mg total) by mouth daily., Disp: 30 tablet, Rfl: 11   cetirizine (ZYRTEC) 10 MG tablet, Take 10 mg by mouth at bedtime., Disp: , Rfl:    Iron -FA-B Cmp-C-Biot-Probiotic (FUSION PLUS) CAPS, Take 1 capsule by mouth daily at 6 (six) AM., Disp: 30 capsule, Rfl: 2   Probiotic Product (PROBIOTIC DAILY PO), Take by mouth at bedtime., Disp: , Rfl:    Allergies  Allergen Reactions   Fluogen [Influenza Virus Vaccine]     guilliam barre      The patient states she uses none for birth control. No LMP recorded (lmp unknown)..  Negative for Dysmenorrhea and Negative for Menorrhagia. Negative for: breast discharge, breast lump(s), breast pain and breast self exam. Associated symptoms include abnormal vaginal bleeding. Pertinent negatives include abnormal bleeding (hematology), anxiety, decreased libido, depression, difficulty falling sleep, dyspareunia, history of infertility, nocturia, sexual dysfunction, sleep disturbances, urinary incontinence, urinary urgency, vaginal discharge and vaginal itching. Diet regular; she has been trying to be more intentional. She is having sweet cravings. She has been looking at healthier. The patient states her exercise level is vigorous - 7 days a week. Trying to get 10,000 steps a day, started last week.   The patient's tobacco use is:  Social History   Tobacco Use  Smoking Status Never  Smokeless Tobacco Never   She has been exposed to passive smoke. The patient's alcohol use is:  Social History   Substance and Sexual Activity  Alcohol Use No   Additional information: Last pap ***, next one scheduled for ***.    Review of Systems  Constitutional:  Negative for fatigue.  HENT: Negative.    Eyes: Negative.   Respiratory: Negative.    Cardiovascular: Negative.   Gastrointestinal: Negative.   Endocrine: Negative.   Genitourinary: Negative.   Musculoskeletal: Negative.   Skin: Negative.   Allergic/Immunologic: Negative.   Neurological: Negative.   Hematological: Negative.   Psychiatric/Behavioral: Negative.  Today's Vitals   01/18/24 1522  BP: 110/70  Pulse: 84  Temp: 98.4 F (36.9 C)  TempSrc: Oral  Weight: 190 lb 9.6 oz (86.5 kg)  Height: 5\' 4"  (1.626 m)  PainSc: 0-No pain   Body mass index is 32.72 kg/m.  Wt Readings from Last 3 Encounters:  01/18/24 190 lb 9.6 oz (86.5 kg)  09/20/23 178 lb 3.2 oz (80.8 kg)  07/14/23 170 lb (77.1 kg)     Objective:  Physical Exam      Assessment And Plan:     Encounter for annual health  examination  Essential hypertension -     EKG 12-Lead -     POCT URINALYSIS DIP (CLINITEK) -     Microalbumin / creatinine urine ratio  Iron  deficiency anemia secondary to inadequate dietary iron  intake  COVID-19 vaccination declined  Other long term (current) drug therapy     Return for 1 year physical, 6 month bp check. Patient was given opportunity to ask questions. Patient verbalized understanding of the plan and was able to repeat key elements of the plan. All questions were answered to their satisfaction.   Susanna Epley, FNP  I, Susanna Epley, FNP, have reviewed all documentation for this visit. The documentation on 01/18/24 for the exam, diagnosis, procedures, and orders are all accurate and complete.

## 2024-01-18 NOTE — Patient Instructions (Signed)
 [facility] - call to get in with sleep provider Address: [address], [place], [place] [address] Phone: [phone]

## 2024-01-19 LAB — LIPID PANEL
Chol/HDL Ratio: 3.7 ratio (ref 0.0–4.4)
Cholesterol, Total: 169 mg/dL (ref 100–199)
HDL: 46 mg/dL (ref >39)
LDL Chol Calc (NIH): 91 mg/dL (ref 0–99)
Triglycerides: 186 mg/dL — ABNORMAL HIGH (ref 0–149)
VLDL Cholesterol Cal: 32 mg/dL (ref 5–40)

## 2024-01-19 LAB — HEMOGLOBIN A1C
Est. average glucose Bld gHb Est-mCnc: 114 mg/dL
Hgb A1c MFr Bld: 5.6 % (ref 4.8–5.6)

## 2024-01-19 LAB — CBC WITH DIFFERENTIAL/PLATELET
Basophils Absolute: 0.1 10*3/uL (ref 0.0–0.2)
Basos: 1 %
EOS (ABSOLUTE): 0.2 10*3/uL (ref 0.0–0.4)
Eos: 2 %
Hematocrit: 39.4 % (ref 34.0–46.6)
Hemoglobin: 12.7 g/dL (ref 11.1–15.9)
Immature Grans (Abs): 0 10*3/uL (ref 0.0–0.1)
Immature Granulocytes: 0 %
Lymphocytes Absolute: 2 10*3/uL (ref 0.7–3.1)
Lymphs: 28 %
MCH: 30.1 pg (ref 26.6–33.0)
MCHC: 32.2 g/dL (ref 31.5–35.7)
MCV: 93 fL (ref 79–97)
Monocytes Absolute: 0.5 10*3/uL (ref 0.1–0.9)
Monocytes: 7 %
Neutrophils Absolute: 4.4 10*3/uL (ref 1.4–7.0)
Neutrophils: 62 %
Platelets: 222 10*3/uL (ref 150–450)
RBC: 4.22 x10E6/uL (ref 3.77–5.28)
RDW: 13.2 % (ref 11.7–15.4)
WBC: 7.1 10*3/uL (ref 3.4–10.8)

## 2024-01-19 LAB — CMP14+EGFR
ALT: 21 IU/L (ref 0–32)
AST: 37 IU/L (ref 0–40)
Albumin: 4.3 g/dL (ref 3.9–4.9)
Alkaline Phosphatase: 60 IU/L (ref 44–121)
BUN/Creatinine Ratio: 20 (ref 9–23)
BUN: 16 mg/dL (ref 6–20)
Bilirubin Total: <0.2 mg/dL (ref 0.0–1.2)
CO2: 20 mmol/L (ref 20–29)
Calcium: 9.3 mg/dL (ref 8.7–10.2)
Chloride: 103 mmol/L (ref 96–106)
Creatinine, Ser: 0.8 mg/dL (ref 0.57–1.00)
Globulin, Total: 2.9 g/dL (ref 1.5–4.5)
Glucose: 87 mg/dL (ref 70–99)
Potassium: 4.4 mmol/L (ref 3.5–5.2)
Sodium: 138 mmol/L (ref 134–144)
Total Protein: 7.2 g/dL (ref 6.0–8.5)
eGFR: 100 mL/min/{1.73_m2} (ref >59)

## 2024-01-19 LAB — MICROALBUMIN / CREATININE URINE RATIO
Creatinine, Urine: 157.1 mg/dL
Microalb/Creat Ratio: <2 mg/g{creat} (ref 0–29)
Microalbumin, Urine: <3 ug/mL

## 2024-01-21 ENCOUNTER — Ambulatory Visit: Payer: Self-pay | Admitting: Nurse Practitioner

## 2024-01-21 DIAGNOSIS — Z0000 Encounter for general adult medical examination without abnormal findings: Secondary | ICD-10-CM | POA: Insufficient documentation

## 2024-01-21 NOTE — Assessment & Plan Note (Signed)
 Had been taking iron supplement will recheck today

## 2024-01-21 NOTE — Assessment & Plan Note (Signed)
 A1c is slightly elevated, encouraged to focus on healthy diet and regular exercise.

## 2024-01-21 NOTE — Assessment & Plan Note (Signed)

## 2024-01-21 NOTE — Assessment & Plan Note (Signed)
 Blood pressure is better and she is tolerating amlodipine .

## 2024-01-21 NOTE — Assessment & Plan Note (Signed)
 Behavior modifications discussed and diet history reviewed.   Pt will continue to exercise regularly and modify diet with low GI, plant based foods and decrease intake of processed foods.  Recommend intake of daily multivitamin, Vitamin D, and calcium.  Recommend monthly self breast exam for preventive screenings, as well as recommend immunizations that include influenza, TDAP

## 2024-04-18 LAB — HM PAP SMEAR

## 2024-05-04 ENCOUNTER — Encounter: Payer: Self-pay | Admitting: Nurse Practitioner

## 2024-05-05 ENCOUNTER — Encounter: Payer: Self-pay | Admitting: Family Medicine

## 2024-05-05 ENCOUNTER — Ambulatory Visit (INDEPENDENT_AMBULATORY_CARE_PROVIDER_SITE_OTHER): Admitting: Family Medicine

## 2024-05-05 VITALS — BP 122/78 | HR 68 | Temp 98.1°F | Ht 64.0 in | Wt 186.0 lb

## 2024-05-05 DIAGNOSIS — R21 Rash and other nonspecific skin eruption: Secondary | ICD-10-CM

## 2024-05-05 DIAGNOSIS — E6609 Other obesity due to excess calories: Secondary | ICD-10-CM

## 2024-05-05 DIAGNOSIS — E66811 Obesity, class 1: Secondary | ICD-10-CM

## 2024-05-05 DIAGNOSIS — Z6831 Body mass index (BMI) 31.0-31.9, adult: Secondary | ICD-10-CM

## 2024-05-05 MED ORDER — PREDNISONE 10 MG (21) PO TBPK: ORAL_TABLET | ORAL | 0 refills | Status: DC

## 2024-05-05 MED ORDER — TRIAMCINOLONE ACETONIDE 0.1 % EX CREA: TOPICAL_CREAM | CUTANEOUS | 0 refills | Status: DC

## 2024-05-05 NOTE — Progress Notes (Signed)
 I,Holly Estrada, CMA,acting as a Neurosurgeon for [employer], NP.,have documented all relevant documentation on the behalf of Holly Creighton, NP,as directed by  Holly Creighton, NP while in the presence of Holly Creighton, NP.  Subjective:  Patient ID: Holly Estrada , female    DOB: Sep 24, 1990 , 33 y.o.   MRN: [id number]  Chief Complaint  Patient presents with   Rash    Patient presents today for rash on arms , chest & lower legs. This was initially noticed in may which appeared as chill bumps on her arms. She admits experiencing itchiness. Happens mostly when she is fresh out the shower, in the sun, or she feels hot.     HPI Discussed the use of AI scribe software for clinical note transcription with the patient, who gave verbal consent to proceed.  History of Present Illness     Holly Estrada is a 33 year old female who presents with a rash.  The rash began as small bumps in May and has become more prominent and widespread over the past two to three weeks, affecting both arms and her chest. It is itchy, particularly when she is hot or in the sun. It is not currently itching but becomes itchy when she is hot or after showering.  She denies any recent changes in creams, detergents, or clothing but mentions starting ashwagandha and another herbal supplement two weeks ago, which she has since discontinued without improvement in her symptoms. She has been hesitant to apply moisturizers since the rash appeared, fearing it might worsen the condition.  No fever, tingling, or burning sensations associated with the rash.      Past Medical History:  Diagnosis Date   History of Guillain-Barre syndrome 2013   02-10-2021  per pt no residual   History of pregnancy induced hypertension 2017   postpartum preeclampsia   Hypertension 2017   PONV (postoperative nausea and vomiting)    Umbilical hernia      Family History  Problem Relation Age of Onset   Hypertension Mother    Healthy Father     Hypertension Father    Cancer Maternal Grandmother    Kidney failure Maternal Grandfather      Current Outpatient Medications:    cetirizine (ZYRTEC) 10 MG tablet, Take 10 mg by mouth at bedtime., Disp: , Rfl:    Iron -FA-B Cmp-C-Biot-Probiotic (FUSION PLUS) CAPS, Take 1 capsule by mouth daily at 6 (six) AM., Disp: 30 capsule, Rfl: 2   predniSONE  (STERAPRED UNI-PAK 21 TAB) 10 MG (21) TBPK tablet, Please dispense with instructions (Patient not taking: Reported on 05/17/2024), Disp: 21 tablet, Rfl: 0   Probiotic Product (PROBIOTIC DAILY PO), Take by mouth at bedtime., Disp: , Rfl:    triamcinolone  cream (KENALOG ) 0.1 %, APPLY TO AFFECTED AREA TWICE DAILY AS NEEDED, Disp: 45 g, Rfl: 0   amLODipine  (NORVASC ) 2.5 MG tablet, Take 1 tablet (2.5 mg total) by mouth daily., Disp: 90 tablet, Rfl: 1   Allergies  Allergen Reactions   Fluogen [Influenza Virus Vaccine]     guilliam barre     Review of Systems  Constitutional: Negative.   Respiratory: Negative.    Cardiovascular: Negative.   Musculoskeletal: Negative.   Skin:  Positive for rash.  Neurological: Negative.   Hematological: Negative.   Psychiatric/Behavioral: Negative.       Today's Vitals   05/05/24 0944  BP: 122/78  Pulse: 68  Temp: 98.1 F (36.7 C)  SpO2: 98%  Weight: 186 lb (84.4 kg)  Height: 5' 4 (1.626 m)   Body mass index is 31.93 kg/m.  Wt Readings from Last 3 Encounters:  05/17/24 186 lb 3.2 oz (84.5 kg)  05/05/24 186 lb (84.4 kg)  01/18/24 190 lb 9.6 oz (86.5 kg)     Objective:  Physical Exam HENT:     Head: Normocephalic.  Cardiovascular:     Rate and Rhythm: Normal rate and regular rhythm.  Pulmonary:     Effort: Pulmonary effort is normal.     Breath sounds: Normal breath sounds.  Skin:    Findings: Rash present.  Neurological:     Mental Status: She is alert and oriented to person, place, and time. Mental status is at baseline.         Assessment And Plan:  Rash -     predniSONE ; Please  dispense with instructions (Patient not taking: Reported on 05/17/2024)  Dispense: 21 tablet; Refill: 0 -     Triamcinolone  Acetonide; APPLY TO AFFECTED AREA TWICE DAILY AS NEEDED  Dispense: 45 g; Refill: 0 -     Ambulatory referral to Dermatology  Class 1 obesity due to excess calories with serious comorbidity and body mass index (BMI) of 31.0 to 31.9 in adult Assessment & Plan: She is encouraged to strive for BMI less than 30 to decrease cardiac risk. Advised to aim for at least 150 minutes of exercise per week.       Return if symptoms worsen or fail to improve, for keep next appt.  Patient was given opportunity to ask questions. Patient verbalized understanding of the plan and was able to repeat key elements of the plan. All questions were answered to their satisfaction.  I, Holly Creighton, NP, have reviewed all documentation for this visit. The documentation on 05/16/2024 for the exam, diagnosis, procedures, and orders are all accurate and complete.   IF YOU HAVE BEEN REFERRED TO A SPECIALIST, IT MAY TAKE 1-2 WEEKS TO SCHEDULE/PROCESS THE REFERRAL. IF YOU HAVE NOT HEARD FROM US /SPECIALIST IN TWO WEEKS, PLEASE GIVE US  A CALL AT [phone].   THE PATIENT IS ENCOURAGED TO PRACTICE SOCIAL DISTANCING DUE TO THE COVID-19 PANDEMIC.

## 2024-05-05 NOTE — Patient Instructions (Signed)
 Rash, Adult  A rash is a breakout of spots or blotches on the skin. It can change the way your skin looks and feels. Many things can cause a rash. The goal of treatment is to stop the itching and keep the rash from spreading. Follow these instructions at home: Medicine Take or apply over-the-counter and prescription medicines only as told by your doctor. These may include medicines to treat: Red or swollen skin. Itching. An allergy. Pain. An infection.  Skin care Put a cool, wet cloth on the rash. Do not scratch or rub your skin. Try not to cover the rash. Keep it exposed to air as often as you can. Managing itching and discomfort Avoid hot showers or baths. These can make itching worse. A cold shower may help. Try taking a bath with: Epsom salts. You can get these at your pharmacy or grocery store. Follow the instructions on the package. Baking soda. Pour a small amount into the bath as told by your doctor. Colloidal oatmeal. You can get this at your pharmacy or grocery store. Follow the instructions on the package. Try putting baking soda paste on your skin. Stir water into baking soda until it gets like a paste. Try putting on a lotion to help with itching (calamine lotion). Keep cool. Stay out of the sun. Sweating and being hot can make itching worse. General instructions  Rest as needed. Drink enough fluid to keep your pee (urine) pale yellow. Wear loose-fitting clothes. Avoid scented soaps, detergents, and perfumes. Use gentle soaps, detergents, perfumes, and cosmetics. Avoid the things that cause your rash. Keep a journal to help keep track of what causes your rash. Write down: What you eat. What cosmetics you use. What you drink. What you wear. This includes jewelry. Contact a doctor if: You sweat a lot at night. You pee (urinate) more or less than normal. Your pee is a darker color than normal. Your eyes are sensitive to light. Your skin or the white parts of your  eyes turn yellow. Your skin tingles or is numb. You get painful blisters in your nose or mouth. Your rash does not go away after a few days, or it gets worse. You are more tired than normal. You are more thirsty than normal. You have new or worse symptoms. These may include: Pain in your belly. A fever. Watery poop (diarrhea). Vomiting. Weakness. Weight loss. Get help right away if: You start to feel mixed up (confused). You have a very bad headache or a stiff neck. You have very bad joint pain or stiffness. You get very sleepy or not responsive. You have a seizure. This information is not intended to replace advice given to you by your health care provider. Make sure you discuss any questions you have with your health care provider. Document Revised: 05/29/2022 Document Reviewed: 05/29/2022 Elsevier Patient Education  2024 [employer].

## 2024-05-16 ENCOUNTER — Ambulatory Visit: Admitting: Nurse Practitioner

## 2024-05-17 ENCOUNTER — Ambulatory Visit (INDEPENDENT_AMBULATORY_CARE_PROVIDER_SITE_OTHER): Admitting: Nurse Practitioner

## 2024-05-17 ENCOUNTER — Encounter: Payer: Self-pay | Admitting: Nurse Practitioner

## 2024-05-17 VITALS — BP 120/60 | HR 73 | Temp 99.1°F | Ht 64.0 in | Wt 186.2 lb

## 2024-05-17 DIAGNOSIS — R21 Rash and other nonspecific skin eruption: Secondary | ICD-10-CM

## 2024-05-17 DIAGNOSIS — Z139 Encounter for screening, unspecified: Secondary | ICD-10-CM

## 2024-05-17 DIAGNOSIS — Z6831 Body mass index (BMI) 31.0-31.9, adult: Secondary | ICD-10-CM

## 2024-05-17 DIAGNOSIS — I10 Essential (primary) hypertension: Secondary | ICD-10-CM | POA: Diagnosis not present

## 2024-05-17 DIAGNOSIS — D508 Other iron deficiency anemias: Secondary | ICD-10-CM | POA: Diagnosis not present

## 2024-05-17 DIAGNOSIS — E6609 Other obesity due to excess calories: Secondary | ICD-10-CM

## 2024-05-17 DIAGNOSIS — L659 Nonscarring hair loss, unspecified: Secondary | ICD-10-CM

## 2024-05-17 DIAGNOSIS — E66811 Obesity, class 1: Secondary | ICD-10-CM

## 2024-05-17 MED ORDER — AMLODIPINE BESYLATE 2.5 MG PO TABS: 2.5000 mg | ORAL_TABLET | Freq: Every day | ORAL | 1 refills | Status: DC

## 2024-05-17 NOTE — Progress Notes (Signed)
 LILLETTE Kristeen JINNY Gladis, CMA,acting as a Neurosurgeon for Gaines Ada, FNP.,have documented all relevant documentation on the behalf of Gaines Ada, FNP,as directed by  Gaines Ada, FNP while in the presence of Gaines Ada, FNP.  Subjective:  Patient ID: Holly Estrada , female    DOB: [birth year] , 33 y.o.   MRN: [id number]  Chief Complaint  Patient presents with   Hair/Scalp Problem    Patient reports she went to a hair styles last Friday and  was told she had breakage in the back of her hair. Her hair styles informed her it could be something internal causing her hair breakage and she recommended labs.     Rash    Patient reports she has had a rash in May, she reports it has since spread to both arms, chest and legs. She reports it is itchy and it itches more when hot. She was treated by another provider but was skeptical about the medication because the provider didn't tell her what the rash was. She reports the provider told her she looked cold.     HPI  Discussed the use of AI scribe software for clinical note transcription with the patient, who gave verbal consent to proceed.  History of Present Illness Holly Estrada is a 33 year old female who presents with concerns about hair loss and breakage.  She has experienced hair breakage at the back of her head for several years, despite regular visits to a stylist for seven years. The issue persists despite trying different stylists and treatments, including hydrating steam treatments and deep conditioning. Recently, a new stylist used a camera to examine her scalp and recommended checking her labs before making further conclusions.  She is currently taking an over-the-counter iron  supplement, 'Garden of Life', and drinks half her body weight in water  daily. Her hair is described as dry and brittle, though not as severely as before. She primarily uses protective hairstyles such as two-strand twists, bantu knots, and braids.  She has a rash located on  her arms, legs, and chest, sparing the stomach, bottom, and back. The rash itches, especially when she is hot or wakes up in the middle of the night. She reports having been in the sun. She takes cetirizine daily for the itching.  She has not started any new medications recently but had used a new shower gel and ashwagandha, both of which she discontinued a month ago without improvement in the rash. She denies any new detergents or soaps other than switching to [employer]. No fatigue is reported.   Past Medical History:  Diagnosis Date   History of Guillain-Barre syndrome 2013   02-10-2021  per pt no residual   History of pregnancy induced hypertension 2017   postpartum preeclampsia   Hypertension 2017   PONV (postoperative nausea and vomiting)    Umbilical hernia      Family History  Problem Relation Age of Onset   Hypertension Mother    Healthy Father    Hypertension Father    Cancer Maternal Grandmother    Kidney failure Maternal Grandfather      Current Outpatient Medications:    cetirizine (ZYRTEC) 10 MG tablet, Take 10 mg by mouth at bedtime., Disp: , Rfl:    Iron -FA-B Cmp-C-Biot-Probiotic (FUSION PLUS) CAPS, Take 1 capsule by mouth daily at 6 (six) AM., Disp: 30 capsule, Rfl: 2   Probiotic Product (PROBIOTIC DAILY PO), Take by mouth at bedtime., Disp: , Rfl:    triamcinolone  cream (KENALOG ) 0.1 %,  APPLY TO AFFECTED AREA TWICE DAILY AS NEEDED, Disp: 45 g, Rfl: 0   amLODipine  (NORVASC ) 2.5 MG tablet, Take 1 tablet (2.5 mg total) by mouth daily., Disp: 90 tablet, Rfl: 1   predniSONE  (STERAPRED UNI-PAK 21 TAB) 10 MG (21) TBPK tablet, Please dispense with instructions (Patient not taking: Reported on 05/17/2024), Disp: 21 tablet, Rfl: 0   Allergies  Allergen Reactions   Fluogen [Influenza Virus Vaccine]     guilliam barre     Review of Systems  Constitutional:  Negative for fatigue.  HENT: Negative.    Eyes: Negative.   Respiratory: Negative.    Cardiovascular:  Negative.   Gastrointestinal: Negative.   Musculoskeletal: Negative.   Skin:        Hair thinning  Neurological: Negative.   Psychiatric/Behavioral: Negative.       Today's Vitals   05/17/24 1625  BP: 120/60  Pulse: 73  Temp: 99.1 F (37.3 C)  TempSrc: Oral  Weight: 186 lb 3.2 oz (84.5 kg)  Height: 5' 4 (1.626 m)  PainSc: 0-No pain   Body mass index is 31.96 kg/m.  Wt Readings from Last 3 Encounters:  05/17/24 186 lb 3.2 oz (84.5 kg)  05/05/24 186 lb (84.4 kg)  01/18/24 190 lb 9.6 oz (86.5 kg)     Objective:  Physical Exam Vitals and nursing note reviewed.  Constitutional:      General: She is not in acute distress.    Appearance: Normal appearance. She is obese.  Cardiovascular:     Rate and Rhythm: Normal rate and regular rhythm.     Pulses: Normal pulses.     Heart sounds: Normal heart sounds. No murmur heard. Pulmonary:     Effort: Pulmonary effort is normal. No respiratory distress.     Breath sounds: Normal breath sounds. No wheezing.  Neurological:     General: No focal deficit present.     Mental Status: She is alert and oriented to person, place, and time.     Cranial Nerves: No cranial nerve deficit.     Motor: No weakness.  Psychiatric:        Mood and Affect: Mood normal.        Behavior: Behavior normal.        Thought Content: Thought content normal.        Judgment: Judgment normal.      Assessment And Plan:  Hair thinning Assessment & Plan: Chronic hair breakage and inability to grow hair at the back of the scalp for seven years. No prior workup. Possible internal causes. - Order labs for iron  levels, thyroid function, vitamin B, and vitamin D . - Consider dermatologist referral if labs are normal.  Orders: -     VITAMIN D  25 Hydroxy (Vit-D Deficiency, Fractures) -     TSH -     Vitamin B12  Rash Assessment & Plan: Rash on arms, legs, and chest, sparing stomach, bottom, and back. Itching when hot or after hot shower. Differential  includes heat rash or sun allergy. Not consistent with food allergy. - Prescribed prednisone  for itching and rash clearance. - Advised steroid cream for severe itching. - Suggested lukewarm showers to reduce rash occurrence.   Essential hypertension Assessment & Plan: Managed with Amlodipine  2.5 MG daily. - Prescribed 90-day supply of Amlodipine .  Orders: -     amLODIPine  Besylate; Take 1 tablet (2.5 mg total) by mouth daily.  Dispense: 90 tablet; Refill: 1  Iron  deficiency anemia secondary to inadequate dietary iron  intake Assessment &  Plan: Will check iron  levels She is taking over-the-counter iron  supplement. - Continue current iron  supplementation.  Orders: -     Iron , TIBC and Ferritin Panel  Class 1 obesity due to excess calories with serious comorbidity and body mass index (BMI) of 31.0 to 31.9 in adult Assessment & Plan: She is encouraged to strive for BMI less than 30 to decrease cardiac risk. Advised to aim for at least 150 minutes of exercise per week.    Encounter for screening -     Hepatitis B surface antibody,qualitative    Return for keep same next.  Patient was given opportunity to ask questions. Patient verbalized understanding of the plan and was able to repeat key elements of the plan. All questions were answered to their satisfaction.   LILLETTE Gaines Ada, FNP, have reviewed all documentation for this visit. The documentation on 05/17/24 for the exam, diagnosis, procedures, and orders are all accurate and complete.    IF YOU HAVE BEEN REFERRED TO A SPECIALIST, IT MAY TAKE 1-2 WEEKS TO SCHEDULE/PROCESS THE REFERRAL. IF YOU HAVE NOT HEARD FROM US /SPECIALIST IN TWO WEEKS, PLEASE GIVE US  A CALL AT [phone].

## 2024-05-17 NOTE — Assessment & Plan Note (Signed)
 She is encouraged to strive for BMI less than 30 to decrease cardiac risk. Advised to aim for at least 150 minutes of exercise per week.

## 2024-05-17 NOTE — Patient Instructions (Signed)
 This is the Dermatologist DR JENEAL [phone]

## 2024-05-18 LAB — IRON,TIBC AND FERRITIN PANEL
Ferritin: 18 ng/mL (ref 15–150)
Iron Saturation: 14 % — ABNORMAL LOW (ref 15–55)
Iron: 45 ug/dL (ref 27–159)
Total Iron Binding Capacity: 318 ug/dL (ref 250–450)
UIBC: 273 ug/dL (ref 131–425)

## 2024-05-18 LAB — TSH: TSH: 1.96 u[IU]/mL (ref 0.450–4.500)

## 2024-05-18 LAB — VITAMIN D 25 HYDROXY (VIT D DEFICIENCY, FRACTURES): Vit D, 25-Hydroxy: 38.2 ng/mL (ref 30.0–100.0)

## 2024-05-18 LAB — HEPATITIS B SURFACE ANTIBODY,QUALITATIVE: Hep B Surface Ab, Qual: REACTIVE

## 2024-05-18 LAB — VITAMIN B12: Vitamin B-12: 676 pg/mL (ref 232–1245)

## 2024-05-29 ENCOUNTER — Ambulatory Visit: Payer: Self-pay | Admitting: Nurse Practitioner

## 2024-05-29 ENCOUNTER — Other Ambulatory Visit: Payer: Self-pay | Admitting: Nurse Practitioner

## 2024-05-29 DIAGNOSIS — L659 Nonscarring hair loss, unspecified: Secondary | ICD-10-CM | POA: Insufficient documentation

## 2024-05-29 NOTE — Assessment & Plan Note (Signed)
 Managed with Amlodipine  2.5 MG daily. - Prescribed 90-day supply of Amlodipine .

## 2024-05-29 NOTE — Assessment & Plan Note (Signed)
 Will check iron  levels She is taking over-the-counter iron  supplement. - Continue current iron  supplementation.

## 2024-05-29 NOTE — Assessment & Plan Note (Signed)
 Chronic hair breakage and inability to grow hair at the back of the scalp for seven years. No prior workup. Possible internal causes. - Order labs for iron  levels, thyroid function, vitamin B, and vitamin D . - Consider dermatologist referral if labs are normal.

## 2024-05-29 NOTE — Assessment & Plan Note (Signed)
 Rash on arms, legs, and chest, sparing stomach, bottom, and back. Itching when hot or after hot shower. Differential includes heat rash or sun allergy. Not consistent with food allergy. - Prescribed prednisone  for itching and rash clearance. - Advised steroid cream for severe itching. - Suggested lukewarm showers to reduce rash occurrence.

## 2024-05-29 NOTE — Assessment & Plan Note (Signed)
 She is encouraged to strive for BMI less than 30 to decrease cardiac risk. Advised to aim for at least 150 minutes of exercise per week.

## 2024-06-01 ENCOUNTER — Ambulatory Visit: Admitting: Nurse Practitioner

## 2024-07-24 ENCOUNTER — Ambulatory Visit: Payer: Self-pay | Admitting: Nurse Practitioner

## 2024-07-24 NOTE — Progress Notes (Signed)
 Date of COVID positive in last 90 days:  PCP - Gaines Ada, FNP Cardiologist -   Chest x-ray - N/A EKG - 01-18-24 Epic Stress Test - N/A ECHO - 02-16-23 Epic Cardiac Cath - N/A Pacemaker/ICD device last checked:N/A Spinal Cord Stimulator:N/A  Bowel Prep - N/A  Sleep Study - N/A CPAP -   Fasting Blood Sugar - N/A Checks Blood Sugar _____ times a day  Last dose of GLP1 agonist-  N/A GLP1 instructions:  Do not take after     Last dose of SGLT-2 inhibitors-  N/A SGLT-2 instructions:  Do not take after     Blood Thinner Instructions: N/A Last dose:   Time: Aspirin Instructions:N/A Last Dose:  Activity level:  Can go up a flight of stairs and perform activities of daily living without stopping and without symptoms of chest pain or shortness of breath.  Able to exercise without symptoms  Unable to go up a flight of stairs without symptoms of     Anesthesia review: Hx of Guillan-Barre Syndrome 2022  Patient denies shortness of breath, fever, cough and chest pain at PAT appointment  Patient verbalized understanding of instructions that were given to them at the PAT appointment. Patient was also instructed that they will need to review over the PAT instructions again at home before surgery.

## 2024-07-24 NOTE — Progress Notes (Deleted)
 LILLETTE Kristeen JINNY Gladis, CMA,acting as a neurosurgeon for Gaines Ada, FNP.,have documented all relevant documentation on the behalf of Gaines Ada, FNP,as directed by  Gaines Ada, FNP while in the presence of Gaines Ada, FNP.  Subjective:  Patient ID: Holly Estrada , female    DOB: [birth year] , 33 y.o.   MRN: [id number]  No chief complaint on file.   HPI  HPI   Past Medical History:  Diagnosis Date   History of Guillain-Barre syndrome 2013   02-10-2021  per pt no residual   History of pregnancy induced hypertension 2017   postpartum preeclampsia   Hypertension 2017   PONV (postoperative nausea and vomiting)    Umbilical hernia      Family History  Problem Relation Age of Onset   Hypertension Mother    Healthy Father    Hypertension Father    Cancer Maternal Grandmother    Kidney failure Maternal Grandfather      Current Outpatient Medications:    amLODipine  (NORVASC ) 2.5 MG tablet, Take 1 tablet (2.5 mg total) by mouth daily., Disp: 90 tablet, Rfl: 1   cetirizine (ZYRTEC) 10 MG tablet, Take 10 mg by mouth at bedtime., Disp: , Rfl:    Iron -FA-B Cmp-C-Biot-Probiotic (FUSION PLUS) CAPS, Take 1 capsule by mouth daily at 6 (six) AM., Disp: 30 capsule, Rfl: 2   predniSONE  (STERAPRED UNI-PAK 21 TAB) 10 MG (21) TBPK tablet, Please dispense with instructions (Patient not taking: Reported on 05/17/2024), Disp: 21 tablet, Rfl: 0   Probiotic Product (PROBIOTIC DAILY PO), Take by mouth at bedtime., Disp: , Rfl:    triamcinolone  cream (KENALOG ) 0.1 %, APPLY TO AFFECTED AREA TWICE DAILY AS NEEDED, Disp: 45 g, Rfl: 0   Allergies  Allergen Reactions   Fluogen [Influenza Virus Vaccine]     guilliam barre     Review of Systems   There were no vitals filed for this visit. There is no height or weight on file to calculate BMI.  Wt Readings from Last 3 Encounters:  05/17/24 186 lb 3.2 oz (84.5 kg)  05/05/24 186 lb (84.4 kg)  01/18/24 190 lb 9.6 oz (86.5 kg)    The ASCVD Risk score (Arnett  DK, et al., 2019) failed to calculate for the following reasons:   The 2019 ASCVD risk score is only valid for ages 64 to 41  Objective:  Physical Exam      Assessment And Plan:   Assessment & Plan Essential hypertension   No orders of the defined types were placed in this encounter.    No follow-ups on file.  Patient was given opportunity to ask questions. Patient verbalized understanding of the plan and was able to repeat key elements of the plan. All questions were answered to their satisfaction.    LILLETTE Gaines Ada, FNP, have reviewed all documentation for this visit. The documentation on 07/24/24 for the exam, diagnosis, procedures, and orders are all accurate and complete.   IF YOU HAVE BEEN REFERRED TO A SPECIALIST, IT MAY TAKE 1-2 WEEKS TO SCHEDULE/PROCESS THE REFERRAL. IF YOU HAVE NOT HEARD FROM US /SPECIALIST IN TWO WEEKS, PLEASE GIVE US  A CALL AT [phone].

## 2024-07-24 NOTE — Progress Notes (Signed)
 Surgery orders requested via Epic inbox.

## 2024-07-24 NOTE — Patient Instructions (Signed)
 SURGICAL WAITING ROOM VISITATION Patients having surgery or a procedure may have no more than 2 support people in the waiting area - these visitors may rotate.    Children under the age of 78 must have an adult with them who is not the patient.  If the patient needs to stay at the hospital during part of their recovery, the visitor guidelines for inpatient rooms apply. Pre-op nurse will coordinate an appropriate time for 1 support person to accompany patient in pre-op.  This support person may not rotate.    Please refer to the [facility] website for the visitor guidelines for Inpatients (after your surgery is over and you are in a regular room).       Your procedure is scheduled on: 07-27-24   Report to [facility] Main Entrance    Report to admitting at 8:45 AM   Call this number if you have problems the morning of surgery [phone]   Do not eat food :After Midnight.   After Midnight you may have the following liquids until 8:00 AM DAY OF SURGERY  Water  Non-Citrus Juices (without pulp, NO RED-Apple, White grape, White cranberry) Black Coffee (NO MILK/CREAM OR CREAMERS, sugar ok)  Clear Tea (NO MILK/CREAM OR CREAMERS, sugar ok) regular and decaf                             Plain Jell-O (NO RED)                                           Fruit ices (not with fruit pulp, NO RED)                                     Popsicles (NO RED)                                                               Sports drinks like Gatorade (NO RED)                        If you have questions, please contact your surgeon's office.   FOLLOW  ANY ADDITIONAL PRE OP INSTRUCTIONS YOU RECEIVED FROM YOUR SURGEON'S OFFICE!!!     Oral Hygiene is also important to reduce your risk of infection.                                    Remember - BRUSH YOUR TEETH THE MORNING OF SURGERY WITH YOUR REGULAR TOOTHPASTE   Do NOT smoke after Midnight   Take these medicines the morning of surgery with A  SIP OF WATER :    Amlodipine   Stop all vitamins and herbal supplements 7 days before surgery                              You may not have any metal on your body including hair pins, jewelry, and body piercing  Do not wear make-up, lotions, powders, perfumes, or deodorant  Do not wear nail polish including gel and S&S, artificial/acrylic nails, or any other type of covering on natural nails including finger and toenails. If you have artificial nails, gel coating, etc. that needs to be removed by a nail salon please have this removed prior to surgery or surgery may need to be canceled/ delayed if the surgeon/ anesthesia feels like they are unable to be safely monitored.   Do not shave  48 hours prior to surgery.        Do not bring valuables to the hospital. [place] IS NOT RESPONSIBLE   FOR VALUABLES.   Contacts, dentures or bridgework may not be worn into surgery.   DO NOT BRING YOUR HOME MEDICATIONS TO THE HOSPITAL. PHARMACY WILL DISPENSE MEDICATIONS LISTED ON YOUR MEDICATION LIST TO YOU DURING YOUR ADMISSION IN THE HOSPITAL!    Patients discharged on the day of surgery will not be allowed to drive home.  Someone NEEDS to stay with you for the first 24 hours after anesthesia.   Special Instructions: Bring a copy of your healthcare power of attorney and living will documents the day of surgery if you haven't scanned them before.              Please read over the following fact sheets you were given: IF YOU HAVE QUESTIONS ABOUT YOUR PRE-OP INSTRUCTIONS PLEASE CALL [phone] G[facility]  If you received a COVID test during your pre-op visit  it is requested that you wear a mask when out in public, stay away from anyone that may not be feeling well and notify your surgeon if you develop symptoms. If you test positive for Covid or have been in contact with anyone that has tested positive in the last 10 days please notify you surgeon.   - Preparing for Surgery Before  surgery, you can play an important role.  Because skin is not sterile, your skin needs to be as free of germs as possible.  You can reduce the number of germs on your skin by washing with CHG (chlorahexidine gluconate) soap before surgery.  CHG is an antiseptic cleaner which kills germs and bonds with the skin to continue killing germs even after washing. Please DO NOT use if you have an allergy to CHG or antibacterial soaps.  If your skin becomes reddened/irritated stop using the CHG and inform your nurse when you arrive at Short Stay. Do not shave (including legs and underarms) for at least 48 hours prior to the first CHG shower.  You may shave your face/neck.  Please follow these instructions carefully:  1.  Shower with CHG Soap the night before surgery ONLY (DO NOT USE THE SOAP THE MORNING OF SURGERY).  2.  If you choose to wash your hair, wash your hair first as usual with your normal  shampoo.  3.  After you shampoo, rinse your hair and body thoroughly to remove the shampoo.                             4.  Use CHG as you would any other liquid soap.  You can apply chg directly to the skin and wash.  Gently with a scrungie or clean washcloth.  5.  Apply the CHG Soap to your body ONLY FROM THE NECK DOWN.   Do   not use on face/ open  Wound or open sores. Avoid contact with eyes, ears mouth and   genitals (private parts).                       Wash face,  Genitals (private parts) with your normal soap.             6.  Wash thoroughly, paying special attention to the area where your    surgery  will be performed.  7.  Thoroughly rinse your body with warm water  from the neck down.  8.  DO NOT shower/wash with your normal soap after using and rinsing off the CHG Soap.                9.  Pat yourself dry with a clean towel.            10.  Wear clean pajamas.            11.  Place clean sheets on your bed the night of your first shower and do not  sleep with pets. Day of  Surgery : Do not apply any CHG, lotions/deodorants the morning of surgery.  Please wear clean clothes to the hospital/surgery center.  FAILURE TO FOLLOW THESE INSTRUCTIONS MAY RESULT IN THE CANCELLATION OF YOUR SURGERY  PATIENT SIGNATURE_________________________________  NURSE SIGNATURE__________________________________  ________________________________________________________________________

## 2024-07-25 ENCOUNTER — Encounter (HOSPITAL_COMMUNITY): Payer: Self-pay

## 2024-07-25 ENCOUNTER — Encounter (HOSPITAL_COMMUNITY)
Admission: RE | Admit: 2024-07-25 | Discharge: 2024-07-25 | Disposition: A | Source: Ambulatory Visit | Attending: Orthopaedic Surgery

## 2024-07-25 ENCOUNTER — Other Ambulatory Visit: Payer: Self-pay

## 2024-07-25 VITALS — BP 128/97 | HR 76 | Temp 98.4°F | Resp 16 | Ht 64.0 in | Wt 181.0 lb

## 2024-07-25 DIAGNOSIS — D649 Anemia, unspecified: Secondary | ICD-10-CM

## 2024-07-25 DIAGNOSIS — I10 Essential (primary) hypertension: Secondary | ICD-10-CM | POA: Insufficient documentation

## 2024-07-25 DIAGNOSIS — Z01812 Encounter for preprocedural laboratory examination: Secondary | ICD-10-CM | POA: Insufficient documentation

## 2024-07-25 DIAGNOSIS — Z8669 Personal history of other diseases of the nervous system and sense organs: Secondary | ICD-10-CM | POA: Insufficient documentation

## 2024-07-25 DIAGNOSIS — X58XXXA Exposure to other specified factors, initial encounter: Secondary | ICD-10-CM | POA: Insufficient documentation

## 2024-07-25 DIAGNOSIS — Z01818 Encounter for other preprocedural examination: Secondary | ICD-10-CM

## 2024-07-25 DIAGNOSIS — S86012A Strain of left Achilles tendon, initial encounter: Secondary | ICD-10-CM | POA: Insufficient documentation

## 2024-07-25 HISTORY — DX: Personal history of urinary calculi: Z87.442

## 2024-07-25 HISTORY — DX: Unspecified osteoarthritis, unspecified site: M19.90

## 2024-07-25 LAB — CBC
HCT: 34.9 % — ABNORMAL LOW (ref 36.0–46.0)
Hemoglobin: 11.7 g/dL — ABNORMAL LOW (ref 12.0–15.0)
MCH: 30.3 pg (ref 26.0–34.0)
MCHC: 33.5 g/dL (ref 30.0–36.0)
MCV: 90.4 fL (ref 80.0–100.0)
Platelets: 187 K/uL (ref 150–400)
RBC: 3.86 MIL/uL — ABNORMAL LOW (ref 3.87–5.11)
RDW: 12.4 % (ref 11.5–15.5)
WBC: 3.5 K/uL — ABNORMAL LOW (ref 4.0–10.5)
nRBC: 0 % (ref 0.0–0.2)

## 2024-07-25 LAB — BASIC METABOLIC PANEL WITH GFR
Anion gap: 9 (ref 5–15)
BUN: 12 mg/dL (ref 6–20)
CO2: 24 mmol/L (ref 22–32)
Calcium: 9 mg/dL (ref 8.9–10.3)
Chloride: 104 mmol/L (ref 98–111)
Creatinine, Ser: 0.69 mg/dL (ref 0.44–1.00)
GFR, Estimated: >60 mL/min (ref >60)
Glucose, Bld: 107 mg/dL — ABNORMAL HIGH (ref 70–99)
Potassium: 3.9 mmol/L (ref 3.5–5.1)
Sodium: 138 mmol/L (ref 135–145)

## 2024-07-25 NOTE — Anesthesia Preprocedure Evaluation (Signed)
 Anesthesia Evaluation    Airway        Dental   Pulmonary           Cardiovascular hypertension,      Neuro/Psych    GI/Hepatic   Endo/Other    Renal/GU      Musculoskeletal   Abdominal   Peds  Hematology   Anesthesia Other Findings   Reproductive/Obstetrics                              Anesthesia Physical Anesthesia Plan  ASA:   Anesthesia Plan:    Post-op Pain Management:    Induction:   PONV Risk Score and Plan:   Airway Management Planned:   Additional Equipment:   Intra-op Plan:   Post-operative Plan:   Informed Consent:   Plan Discussed with:   Anesthesia Plan Comments: (See PAT note from 12/2 )         Anesthesia Quick Evaluation

## 2024-07-25 NOTE — Discharge Instructions (Signed)
 Lillia Mountain, MD [facility]  Please read the following information regarding your care after surgery.  Medications   - Oxycodone  5 mg every 6 hours as needed for pain - Aspirin  81 mg twice daily as scheduled to prevent blood clots - Colace 100 mg twice daily as needed for constipation - Zofran  4 mg every 8 hours as needed for nausea/vomiting  We send above prescriptions to your pharmacy on file.  In addition you may also use: ? acetaminophen  (Tylenol ) 500 mg every 4-6 hours as you need for minor to moderate pain  Resume all other routine medications per usual or as directed by your PCP/other specialists.  Weight Bearing ? Do NOT bear any weight on the operated leg or foot. This means do NOT touch your surgical leg to the ground!  Cast / Splint / Dressing ? If you have a splint, do NOT remove this. Keep your splint, cast or dressing clean and dry.  Don't put anything (coat hanger, pencil, etc) down inside of it.  If it gets wet, call the office immediately to schedule an appointment for a cast change.  Swelling IMPORTANT: It is normal for you to have swelling where you had surgery. To reduce swelling and pain, keep at least 3 pillows under your leg so that your toes are above your nose and your heel is above the level of your hip.  It may be necessary to keep your foot or leg elevated for several weeks.  This is critical to helping your incisions heal and your pain to feel better.  Follow Up Call my office at [phone] when you are discharged from the hospital or surgery center to schedule an appointment to be seen 7-10 days after surgery.  Call my office at [phone] if you develop a fever >101.5 F, nausea, vomiting, bleeding from the surgical site or severe pain.    No Tylenol  before 8:30pm tonight.   Post Anesthesia Home Care Instructions  Activity: Get plenty of rest for the remainder of the day. A responsible individual must stay with you for 24 hours following  the procedure.  For the next 24 hours, DO NOT: -Drive a car -Advertising copywriter -Drink alcoholic beverages -Take any medication unless instructed by your physician -Make any legal decisions or sign important papers.  Meals: Start with liquid foods such as gelatin or soup. Progress to regular foods as tolerated. Avoid greasy, spicy, heavy foods. If nausea and/or vomiting occur, drink only clear liquids until the nausea and/or vomiting subsides. Call your physician if vomiting continues.  Special Instructions/Symptoms: Your throat may feel dry or sore from the anesthesia or the breathing tube placed in your throat during surgery. If this causes discomfort, gargle with warm salt water. The discomfort should disappear within 24 hours.  If you had a scopolamine  patch placed behind your ear for the management of post- operative nausea and/or vomiting:  1. The medication in the patch is effective for 72 hours, after which it should be removed.  Wrap patch in a tissue and discard in the trash. Wash hands thoroughly with soap and water. 2. You may remove the patch earlier than 72 hours if you experience unpleasant side effects which may include dry mouth, dizziness or visual disturbances. 3. Avoid touching the patch. Wash your hands with soap and water after contact with the patch.   Regional Anesthesia Blocks  1. You may not be able to move or feel the blocked extremity after a regional anesthetic block. This may last may  last from 3-48 hours after placement, but it will go away. The length of time depends on the medication injected and your individual response to the medication. As the nerves start to wake up, you may experience tingling as the movement and feeling returns to your extremity. If the numbness and inability to move your extremity has not gone away after 48 hours, please call your surgeon.   2. The extremity that is blocked will need to be protected until the numbness is gone and the  strength has returned. Because you cannot feel it, you will need to take extra care to avoid injury. Because it may be weak, you may have difficulty moving it or using it. You may not know what position it is in without looking at it while the block is in effect.  3. For blocks in the legs and feet, returning to weight bearing and walking needs to be done carefully. You will need to wait until the numbness is entirely gone and the strength has returned. You should be able to move your leg and foot normally before you try and bear weight or walk. You will need someone to be with you when you first try to ensure you do not fall and possibly risk injury.  4. Bruising and tenderness at the needle site are common side effects and will resolve in a few days.  5. Persistent numbness or new problems with movement should be communicated to the surgeon or the [facility] [phone] [facility] [phone]).

## 2024-07-25 NOTE — H&P (Signed)
 ORTHOPAEDIC SURGERY H&P  Subjective:  The patient presents for left Achilles rupture.   Past Medical History:  Diagnosis Date   Arthritis    History of Guillain-Barre syndrome 2013   02-10-2021  per pt no residual   History of kidney stones    History of pregnancy induced hypertension 2017   postpartum preeclampsia   Hypertension 2017   PONV (postoperative nausea and vomiting)    Umbilical hernia     Past Surgical History:  Procedure Laterality Date   CESAREAN SECTION N/A 12/15/2015   Procedure: CESAREAN SECTION;  Surgeon: Debby JINNY Dinsmore, MD;  Location: [facility] ORS;  Service: Obstetrics;  Laterality: N/A;   CESAREAN SECTION N/A 08/22/2019   Procedure: Repeat CESAREAN SECTION;  Surgeon: Gorge Ade, MD;  Location: [facility] OR[facility];  Service: Obstetrics;  Laterality: N/A;  EDD: 08/30/19   HERNIA REPAIR  June 2022   LAPAROSCOPIC TUBAL LIGATION Bilateral 02/13/2021   Procedure: BILATERAL LAPAROSCOPIC TUBAL LIGATION;  Surgeon: Gorge Ade, MD;  Location: [facility];  Service: Gynecology;  Laterality: Bilateral;   UMBILICAL HERNIA REPAIR N/A 02/13/2021   Procedure: LAPAROSCOPIC UMBILICAL HERNIA WITH MESH/LAPAROSCOPIC BILATERAL TUBAL LIGATION;  Surgeon: Rubin Calamity, MD;  Location: [facility];  Service: General;  Laterality: N/A;     (Not in an outpatient encounter)    Allergies  Allergen Reactions   Fluogen [Influenza Virus Vaccine]     guilliam barre    Social History   Socioeconomic History   Marital status: Married    Spouse name: Not on file   Number of children: Not on file   Years of education: Not on file   Highest education level: Bachelor's degree (e.g., BA, AB, BS)  Occupational History    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  Tobacco Use   Smoking status: Never   Smokeless tobacco: Never  Vaping Use   Vaping status: Never Used  Substance and Sexual Activity   Alcohol use: No   Drug use: Never   Sexual  activity: Yes    Birth control/protection: Surgical  Other Topics Concern   Not on file  Social History Narrative   Not on file   Social Drivers of Health   Financial Resource Strain: Low Risk  (05/04/2024)   Overall Financial Resource Strain (CARDIA)    Difficulty of Paying Living Expenses: Not very hard  Food Insecurity: No Food Insecurity (05/04/2024)   Hunger Vital Sign    Worried About Running Out of Food in the Last Year: Never true    Ran Out of Food in the Last Year: Never true  Transportation Needs: No Transportation Needs (05/04/2024)   PRAPARE - Administrator, Civil Service (Medical): No    Lack of Transportation (Non-Medical): No  Physical Activity: Insufficiently Active (05/04/2024)   Exercise Vital Sign    Days of Exercise per Week: 3 days    Minutes of Exercise per Session: 30 min  Stress: No Stress Concern Present (05/04/2024)   [employer] of Occupational Health - Occupational Stress Questionnaire    Feeling of Stress: Not at all  Social Connections: Socially Integrated (05/04/2024)   Social Connection and Isolation Panel    Frequency of Communication with Friends and Family: More than three times a week    Frequency of Social Gatherings with Friends and Family: Once a week    Attends Religious Services: More than 4 times per year    Active Member of Clubs or Organizations: Yes    Attends  Club or [employer]: More than 4 times per year    Marital Status: Married  Catering Manager Violence: Not on file     History reviewed. No pertinent family history.   Review of Systems Pertinent items are noted in HPI.  Objective: Vital signs in last 24 hours:    07/25/2024    8:31 AM 07/24/2024    4:32 PM 05/17/2024    4:25 PM  Vitals with BMI  Height 5' 4 5' 4 5' 4  Weight 181 lbs 180 lbs 186 lbs 3 oz  BMI 31.05 30.88 31.95  Systolic 128  120  Diastolic 97  60  Pulse 76  73      EXAM: General: Well nourished, well developed.  Awake, alert and oriented to time, place, person. Normal mood and affect. No apparent distress. Breathing room air.  Operative Lower Extremity: Alignment - Neutral Deformity - None Skin intact Tenderness to palpation - None 0/5 TA, 5/5 PT, GS, Per, EHL, FHL Sensation intact to light touch throughout Palpable DP and PT pulses Special testing: None  The contralateral foot/ankle was examined for comparison and noted to be neurovascularly intact with no localized deformity, swelling, or tenderness.  Imaging Review All images taken were independently reviewed by me.  Assessment/Plan: The clinical and radiographic findings were reviewed and discussed at length with the patient.  The patient presents for left Achilles rupture.  We spoke at length about the natural course of these findings. We discussed nonoperative and operative treatment options in detail.  The risks and benefits were presented and reviewed. The risks due to hardware/suture failure and/or irritation (if removing hardware: inability to remove part/all of hardware, recurrent instability), new/persistent infection, stiffness, nerve/vessel/tendon injury or rerupture of repaired tendon, nonunion/malunion, allograft usage, wound healing issues, development of arthritis, failure of this surgery, possibility of external fixation with delayed definitive surgery, need for further surgery, thromboembolic events, anesthesia/medical complications, amputation, death among others were discussed.  Lillia Mountain  Orthopaedic Surgery [facility]

## 2024-07-25 NOTE — Progress Notes (Signed)
 Case: [id number] Date/Time: 07/27/24 1045   Procedures:      REPAIR, TENDON, ACHILLES (Left) - LEFT achilles repair, possible tendon transfer     FASCIECTOMY, LOWER EXTREMITY (Left)   Anesthesia type: General   Diagnosis: Rupture Achilles tendon, left, initial encounter [S86.012A]   Pre-op diagnosis: Rupture of left Achilles tendon, initial encounter   Location: [facility] ROOM 01 / [facility]   Surgeons: Barton Drape, MD       DISCUSSION: Holly Estrada is a 33 yo female with PMH of HTN, Guillain-Barre syndrome (2022), arthritis.  Patient seen in PAT due to URI symptoms. She had a URI that started ~11/25. Has residual cough. She denies fever, SOB. She does not smoke, have any underlying lung disease, or use inhalers. Heart is regular rate and rhythm. Lungs are CTA. Sats are 100% on RA. Discussed with patient regarding risks of anesthesia with URI. She also happened to be talking to her surgeon on the phone who states that surgery is non-elective and needs to proceed.   VS: BP (!) 128/97   Pulse 76   Temp 36.9 C (Oral)   Resp 16   Ht 5' 4 (1.626 m)   Wt 82.1 kg   LMP 07/22/2024 (Exact Date)   SpO2 100%   BMI 31.07 kg/m   PROVIDERS: Georgina Speaks, FNP   LABS: Labs reviewed: Acceptable for surgery. (all labs ordered are listed, but only abnormal results are displayed)  Labs Reviewed  BASIC METABOLIC PANEL WITH GFR - Abnormal; Notable for the following components:      Result Value   Glucose, Bld 107 (*)    All other components within normal limits  CBC - Abnormal; Notable for the following components:   WBC 3.5 (*)    RBC 3.86 (*)    Hemoglobin 11.7 (*)    HCT 34.9 (*)    All other components within normal limits     Echo 02/16/23:  IMPRESSIONS    1. Left ventricular ejection fraction, by estimation, is 60 to 65%. The left ventricle has normal function. The left ventricle has no regional wall motion abnormalities. Left ventricular diastolic parameters were normal.  The average left ventricular global longitudinal strain is -21.8 %. The global longitudinal strain is normal.  2. Right ventricular systolic function is normal. The right ventricular size is normal.  3. The mitral valve is normal in structure. Trivial mitral valve regurgitation. No evidence of mitral stenosis.  4. The aortic valve is tricuspid. Aortic valve regurgitation is not visualized. No aortic stenosis is present.  5. The inferior vena cava is normal in size with greater than 50% respiratory variability, suggesting right atrial pressure of 3 mmHg.  Past Medical History:  Diagnosis Date   Arthritis    History of Guillain-Barre syndrome 2013   02-10-2021  per pt no residual   History of kidney stones    History of pregnancy induced hypertension 2017   postpartum preeclampsia   Hypertension 2017   PONV (postoperative nausea and vomiting)    Umbilical hernia     Past Surgical History:  Procedure Laterality Date   CESAREAN SECTION N/A 12/15/2015   Procedure: CESAREAN SECTION;  Surgeon: Debby JINNY Dinsmore, MD;  Location: [facility] ORS;  Service: Obstetrics;  Laterality: N/A;   CESAREAN SECTION N/A 08/22/2019   Procedure: Repeat CESAREAN SECTION;  Surgeon: Gorge Ade, MD;  Location: [facility];  Service: Obstetrics;  Laterality: N/A;  EDD: 08/30/19   HERNIA REPAIR  June 2022   LAPAROSCOPIC TUBAL  LIGATION Bilateral 02/13/2021   Procedure: BILATERAL LAPAROSCOPIC TUBAL LIGATION;  Surgeon: Gorge Ade, MD;  Location: [facility];  Service: Gynecology;  Laterality: Bilateral;   UMBILICAL HERNIA REPAIR N/A 02/13/2021   Procedure: LAPAROSCOPIC UMBILICAL HERNIA WITH MESH/LAPAROSCOPIC BILATERAL TUBAL LIGATION;  Surgeon: Rubin Calamity, MD;  Location: [facility];  Service: General;  Laterality: N/A;    MEDICATIONS:  amLODipine  (NORVASC ) 2.5 MG tablet   Ascorbic Acid (VITA-C PO)   cetirizine (ZYRTEC) 10 MG tablet   Chlorphen-Pseudoephed-APAP  (CORICIDIN D PO)   DM-Doxylamine-Acetaminophen  (NYQUIL COLD & FLU PO)   guaifenesin (ROBITUSSIN) 100 MG/5ML syrup   Iron -FA-B Cmp-C-Biot-Probiotic (FUSION PLUS) CAPS   Multiple Vitamin (MULTIVITAMIN PO)   Probiotic Product (PROBIOTIC DAILY PO)   VITAMIN D  PO   VITAMIN K PO   No current facility-administered medications for this encounter.    Burnard CHRISTELLA Odis DEVONNA [facility] Short Stay/Anesthesiology [facility] Phone [phone] 07/25/2024 11:40 AM

## 2024-07-26 DIAGNOSIS — I10 Essential (primary) hypertension: Secondary | ICD-10-CM

## 2024-07-26 DIAGNOSIS — Z01818 Encounter for other preprocedural examination: Secondary | ICD-10-CM

## 2024-07-27 ENCOUNTER — Encounter (HOSPITAL_COMMUNITY): Payer: Self-pay | Admitting: Anesthesiology

## 2024-07-27 ENCOUNTER — Encounter (HOSPITAL_COMMUNITY): Admission: RE | Payer: Self-pay | Source: Home / Self Care

## 2024-07-27 ENCOUNTER — Ambulatory Visit (HOSPITAL_COMMUNITY): Admission: RE | Admit: 2024-07-27 | Source: Home / Self Care | Admitting: Orthopaedic Surgery

## 2024-07-27 DIAGNOSIS — I10 Essential (primary) hypertension: Secondary | ICD-10-CM

## 2024-07-27 DIAGNOSIS — Z01818 Encounter for other preprocedural examination: Secondary | ICD-10-CM

## 2024-07-27 SURGERY — REPAIR, TENDON, ACHILLES
Anesthesia: General | Laterality: Left

## 2024-08-01 ENCOUNTER — Encounter (HOSPITAL_COMMUNITY): Payer: Self-pay | Admitting: Orthopaedic Surgery

## 2024-08-02 ENCOUNTER — Other Ambulatory Visit: Payer: Self-pay

## 2024-08-02 ENCOUNTER — Encounter (HOSPITAL_COMMUNITY): Payer: Self-pay | Admitting: Orthopaedic Surgery

## 2024-08-02 NOTE — Progress Notes (Signed)
 Patient currently has no s/sx of an URI infection. Patient denies fever, runny nose, sore throat and no SOB.  Patient was dx with URI on 07/27/24 and was treated.  All symptoms have resolved per patient except an occasional non productive cough.  Patient states feeling well and back to baseline except for the occasional non productive cough.

## 2024-08-02 NOTE — Progress Notes (Signed)
 PCP - Gaines Ada, FNP Cardiologist - none  Chest x-ray - n/a EKG - 01/18/24 Stress Test - n/a ECHO - 02/16/23 Cardiac Cath - n/a  ICD Pacemaker/Loop - n/a  Sleep Study -  n/a  Diabetes - n/a  Aspirin - Patient to follow MD's instructions for ASA prior to surgery.    Blood Thinner Instructions:  n/a  NPO  Anesthesia review: Yes  STOP now taking any Aspirin (unless otherwise instructed by your surgeon), Aleve, Naproxen, Ibuprofen , Motrin , Advil , Goody's, BC's, all herbal medications, fish oil, and all vitamins.   Coronavirus Screening Do you have any of the following symptoms:  Cough occasional cough Fever (>100.14F)  yes/no: No Runny nose yes/no: No Sore throat yes/no: No Difficulty breathing/shortness of breath  yes/no: No  Have you traveled in the last 14 days and where? yes/no: No  Patient verbalized understanding of instructions that were given via phone.

## 2024-08-03 ENCOUNTER — Ambulatory Visit (HOSPITAL_COMMUNITY): Admitting: Physician Assistant

## 2024-08-03 ENCOUNTER — Encounter (HOSPITAL_COMMUNITY): Payer: Self-pay | Admitting: Orthopaedic Surgery

## 2024-08-03 ENCOUNTER — Encounter (HOSPITAL_COMMUNITY): Admission: RE | Disposition: A | Payer: Self-pay | Source: Home / Self Care | Attending: Orthopaedic Surgery

## 2024-08-03 ENCOUNTER — Ambulatory Visit (HOSPITAL_COMMUNITY)
Admission: RE | Admit: 2024-08-03 | Discharge: 2024-08-03 | Disposition: A | Attending: Orthopaedic Surgery | Admitting: Orthopaedic Surgery

## 2024-08-03 DIAGNOSIS — X58XXXA Exposure to other specified factors, initial encounter: Secondary | ICD-10-CM | POA: Diagnosis not present

## 2024-08-03 DIAGNOSIS — I10 Essential (primary) hypertension: Secondary | ICD-10-CM | POA: Diagnosis not present

## 2024-08-03 DIAGNOSIS — Z9889 Other specified postprocedural states: Secondary | ICD-10-CM

## 2024-08-03 DIAGNOSIS — S86012A Strain of left Achilles tendon, initial encounter: Secondary | ICD-10-CM | POA: Diagnosis present

## 2024-08-03 HISTORY — PX: ACHILLES TENDON SURGERY: SHX542

## 2024-08-03 HISTORY — PX: FASCIECTOMY, LOWER EXTREMITY: SHX7334

## 2024-08-03 LAB — POCT PREGNANCY, URINE: Preg Test, Ur: NEGATIVE

## 2024-08-03 SURGERY — REPAIR, TENDON, ACHILLES
Anesthesia: General | Laterality: Left

## 2024-08-03 MED ORDER — PROPOFOL 10 MG/ML IV BOLUS
INTRAVENOUS | Status: AC
  Filled 2024-08-03: qty 20

## 2024-08-03 MED ORDER — ACETAMINOPHEN 500 MG PO TABS
1000.0000 mg | ORAL_TABLET | Freq: Once | ORAL | Status: AC
  Administered 2024-08-03: 1000 mg via ORAL
  Filled 2024-08-03: qty 2

## 2024-08-03 MED ORDER — LIDOCAINE 2% (20 MG/ML) 5 ML SYRINGE
INTRAMUSCULAR | Status: AC
  Filled 2024-08-03: qty 5

## 2024-08-03 MED ORDER — ONDANSETRON HCL 4 MG/2ML IJ SOLN
INTRAMUSCULAR | Status: AC
  Filled 2024-08-03: qty 2

## 2024-08-03 MED ORDER — ORAL CARE MOUTH RINSE: 15.0000 mL | Freq: Once | OROMUCOSAL | Status: AC

## 2024-08-03 MED ORDER — PROPOFOL 500 MG/50ML IV EMUL
INTRAVENOUS | Status: DC | PRN
  Administered 2024-08-03: 125 ug/kg/min via INTRAVENOUS

## 2024-08-03 MED ORDER — CHLORHEXIDINE GLUCONATE 0.12 % MT SOLN
15.0000 mL | Freq: Once | OROMUCOSAL | Status: AC
  Administered 2024-08-03: 15 mL via OROMUCOSAL
  Filled 2024-08-03: qty 15

## 2024-08-03 MED ORDER — LACTATED RINGERS IV SOLN: INTRAVENOUS | Status: DC

## 2024-08-03 MED ORDER — ALBUTEROL SULFATE HFA 108 (90 BASE) MCG/ACT IN AERS
INHALATION_SPRAY | RESPIRATORY_TRACT | Status: AC
  Filled 2024-08-03: qty 6.7

## 2024-08-03 MED ORDER — OXYCODONE HCL 5 MG PO TABS: 5.0000 mg | ORAL_TABLET | ORAL | Status: AC | PRN

## 2024-08-03 MED ORDER — SUGAMMADEX SODIUM 200 MG/2ML IV SOLN
INTRAVENOUS | Status: DC | PRN
  Administered 2024-08-03: 300 mg via INTRAVENOUS

## 2024-08-03 MED ORDER — ONDANSETRON HCL 4 MG/2ML IJ SOLN
INTRAMUSCULAR | Status: DC | PRN
  Administered 2024-08-03: 4 mg via INTRAVENOUS

## 2024-08-03 MED ORDER — DEXMEDETOMIDINE HCL IN NACL 80 MCG/20ML IV SOLN
INTRAVENOUS | Status: DC | PRN
  Administered 2024-08-03: 8 ug via INTRAVENOUS

## 2024-08-03 MED ORDER — MIDAZOLAM HCL 2 MG/2ML IJ SOLN
INTRAMUSCULAR | Status: AC
  Filled 2024-08-03: qty 2

## 2024-08-03 MED ORDER — 0.9 % SODIUM CHLORIDE (POUR BTL) OPTIME
TOPICAL | Status: DC | PRN
  Administered 2024-08-03: 1000 mL

## 2024-08-03 MED ORDER — BUPIVACAINE HCL (PF) 0.5 % IJ SOLN
INTRAMUSCULAR | Status: DC | PRN
  Administered 2024-08-03: 10 mL via PERINEURAL

## 2024-08-03 MED ORDER — CHLORHEXIDINE GLUCONATE 4 % EX SOLN: 60.0000 mL | Freq: Once | CUTANEOUS | Status: DC

## 2024-08-03 MED ORDER — DOCUSATE SODIUM 100 MG PO CAPS: 100.0000 mg | ORAL_CAPSULE | Freq: Two times a day (BID) | ORAL | Status: AC

## 2024-08-03 MED ORDER — CEFAZOLIN SODIUM-DEXTROSE 2-4 GM/100ML-% IV SOLN: 2.0000 g | INTRAVENOUS | Status: DC

## 2024-08-03 MED ORDER — MIDAZOLAM HCL 2 MG/2ML IJ SOLN
INTRAMUSCULAR | Status: AC
  Administered 2024-08-03: 2 mg via INTRAVENOUS
  Filled 2024-08-03: qty 2

## 2024-08-03 MED ORDER — CEFAZOLIN SODIUM-DEXTROSE 2-4 GM/100ML-% IV SOLN
2.0000 g | INTRAVENOUS | Status: AC
  Administered 2024-08-03: 2 g via INTRAVENOUS
  Filled 2024-08-03: qty 100

## 2024-08-03 MED ORDER — IPRATROPIUM-ALBUTEROL 0.5-2.5 (3) MG/3ML IN SOLN
3.0000 mL | Freq: Once | RESPIRATORY_TRACT | Status: AC
  Administered 2024-08-03: 3 mL via RESPIRATORY_TRACT
  Filled 2024-08-03: qty 3

## 2024-08-03 MED ORDER — MIDAZOLAM HCL (PF) 2 MG/2ML IJ SOLN
INTRAMUSCULAR | Status: DC | PRN
  Administered 2024-08-03: 2 mg via INTRAVENOUS

## 2024-08-03 MED ORDER — OXYCODONE HCL 5 MG PO TABS: 5.0000 mg | ORAL_TABLET | Freq: Once | ORAL | Status: DC | PRN

## 2024-08-03 MED ORDER — ONDANSETRON 4 MG PO TBDP: 4.0000 mg | ORAL_TABLET | Freq: Three times a day (TID) | ORAL | Status: AC | PRN

## 2024-08-03 MED ORDER — FENTANYL CITRATE (PF) 250 MCG/5ML IJ SOLN
INTRAMUSCULAR | Status: DC | PRN
  Administered 2024-08-03: 50 ug via INTRAVENOUS
  Administered 2024-08-03: 100 ug via INTRAVENOUS

## 2024-08-03 MED ORDER — FENTANYL CITRATE (PF) 250 MCG/5ML IJ SOLN
INTRAMUSCULAR | Status: AC
  Filled 2024-08-03: qty 5

## 2024-08-03 MED ORDER — ASPIRIN 81 MG PO TBEC: 81.0000 mg | DELAYED_RELEASE_TABLET | Freq: Two times a day (BID) | ORAL | Status: AC

## 2024-08-03 MED ORDER — SUGAMMADEX SODIUM 200 MG/2ML IV SOLN
INTRAVENOUS | Status: AC
  Filled 2024-08-03: qty 2

## 2024-08-03 MED ORDER — DROPERIDOL 2.5 MG/ML IJ SOLN: 0.6250 mg | Freq: Once | INTRAMUSCULAR | Status: DC | PRN

## 2024-08-03 MED ORDER — LIDOCAINE 2% (20 MG/ML) 5 ML SYRINGE
INTRAMUSCULAR | Status: DC | PRN
  Administered 2024-08-03: 60 mg via INTRAVENOUS

## 2024-08-03 MED ORDER — BUPIVACAINE LIPOSOME 1.3 % IJ SUSP
INTRAMUSCULAR | Status: DC | PRN
  Administered 2024-08-03: 10 mL via PERINEURAL

## 2024-08-03 MED ORDER — FENTANYL CITRATE (PF) 100 MCG/2ML IJ SOLN: 25.0000 ug | INTRAMUSCULAR | Status: DC | PRN

## 2024-08-03 MED ORDER — ROCURONIUM BROMIDE 10 MG/ML (PF) SYRINGE
PREFILLED_SYRINGE | INTRAVENOUS | Status: DC | PRN
  Administered 2024-08-03: 50 mg via INTRAVENOUS

## 2024-08-03 MED ORDER — OXYCODONE HCL 5 MG/5ML PO SOLN: 5.0000 mg | Freq: Once | ORAL | Status: DC | PRN

## 2024-08-03 MED ORDER — ROCURONIUM BROMIDE 10 MG/ML (PF) SYRINGE
PREFILLED_SYRINGE | INTRAVENOUS | Status: AC
  Filled 2024-08-03: qty 10

## 2024-08-03 MED ORDER — DEXAMETHASONE SOD PHOSPHATE PF 10 MG/ML IJ SOLN
INTRAMUSCULAR | Status: DC | PRN
  Administered 2024-08-03: 10 mg via INTRAVENOUS

## 2024-08-03 MED ORDER — PROPOFOL 10 MG/ML IV BOLUS
INTRAVENOUS | Status: DC | PRN
  Administered 2024-08-03 (×2): 50 mg via INTRAVENOUS
  Administered 2024-08-03: 150 mg via INTRAVENOUS

## 2024-08-03 MED ORDER — MIDAZOLAM HCL (PF) 2 MG/2ML IJ SOLN: 2.0000 mg | Freq: Once | INTRAMUSCULAR | Status: AC

## 2024-08-03 MED ORDER — ALBUTEROL SULFATE HFA 108 (90 BASE) MCG/ACT IN AERS
INHALATION_SPRAY | RESPIRATORY_TRACT | Status: DC | PRN
  Administered 2024-08-03 (×2): 2 via RESPIRATORY_TRACT

## 2024-08-03 MED ORDER — BUPIVACAINE HCL (PF) 0.25 % IJ SOLN
INTRAMUSCULAR | Status: DC | PRN
  Administered 2024-08-03: 20 mL via PERINEURAL

## 2024-08-03 SURGICAL SUPPLY — 52 items
BLADE LONG MED 31X9 (MISCELLANEOUS) IMPLANT
BLADE MICRO SAGITTAL (BLADE) IMPLANT
BLADE SAW SGTL 13.0X1.19X90.0M (BLADE) IMPLANT
BLADE SURG 15 STRL LF DISP TIS (BLADE) ×2 IMPLANT
BNDG COMPR ESMARK 6X3 LF (GAUZE/BANDAGES/DRESSINGS) IMPLANT
BNDG ELASTIC 6X10 VLCR STRL LF (GAUZE/BANDAGES/DRESSINGS) ×1 IMPLANT
BNDG ELASTIC 6X15 VLCR STRL LF (GAUZE/BANDAGES/DRESSINGS) IMPLANT
BNDG STRETCH GAUZE 3IN X12FT (GAUZE/BANDAGES/DRESSINGS) ×1 IMPLANT
CANISTER SUCT 1200ML W/VALVE (MISCELLANEOUS) ×1 IMPLANT
CHLORAPREP W/TINT 26 (MISCELLANEOUS) ×1 IMPLANT
COVER BACK TABLE 60X90IN (DRAPES) ×1 IMPLANT
CUFF TRNQT CYL 34X4.125X (TOURNIQUET CUFF) ×1 IMPLANT
DRAPE EXTREMITY T 121X128X90 (DISPOSABLE) ×1 IMPLANT
DRAPE IMP U-DRAPE 54X76 (DRAPES) ×1 IMPLANT
DRAPE OEC MINIVIEW 54X84 (DRAPES) IMPLANT
DRAPE U-SHAPE 47X51 STRL (DRAPES) ×1 IMPLANT
DRSG EMULSION OIL 3X3 NADH (GAUZE/BANDAGES/DRESSINGS) IMPLANT
DRSG MEPITEL 4X7.2 (GAUZE/BANDAGES/DRESSINGS) ×1 IMPLANT
ELECTRODE REM PT RTRN 9FT ADLT (ELECTROSURGICAL) ×1 IMPLANT
GAUZE PAD ABD 8X10 STRL (GAUZE/BANDAGES/DRESSINGS) ×5 IMPLANT
GAUZE SPONGE 4X4 12PLY STRL (GAUZE/BANDAGES/DRESSINGS) ×1 IMPLANT
GLOVE BIOGEL PI IND STRL 8 (GLOVE) ×1 IMPLANT
GLOVE SURG SS PI 7.5 STRL IVOR (GLOVE) ×2 IMPLANT
GOWN STRL REUS W/ TWL LRG LVL3 (GOWN DISPOSABLE) ×2 IMPLANT
KIT BASIN OR (CUSTOM PROCEDURE TRAY) ×1 IMPLANT
NDL HYPO 25X1 1.5 SAFETY (NEEDLE) IMPLANT
NDL SUT 6 .5 CRC .975X.05 MAYO (NEEDLE) IMPLANT
PADDING CAST ABS COTTON 4X4 ST (CAST SUPPLIES) IMPLANT
PADDING CAST ABS COTTON 6X4 NS (CAST SUPPLIES) IMPLANT
PADDING CAST SYNTHETIC 4X4 STR (CAST SUPPLIES) ×3 IMPLANT
PADDING CAST SYNTHETIC 6X4 NS (CAST SUPPLIES) ×3 IMPLANT
PENCIL SMOKE EVACUATOR (MISCELLANEOUS) ×1 IMPLANT
SANITIZER HAND ALTRA PUMP 550 (MISCELLANEOUS) ×1 IMPLANT
SHEET MEDIUM DRAPE 40X70 STRL (DRAPES) ×1 IMPLANT
SLEEVE SCD COMPRESS KNEE MED (STOCKING) ×1 IMPLANT
SPLINT FIBERGLASS 4X30 (CAST SUPPLIES) ×1 IMPLANT
SPONGE T-LAP 18X18 ~~LOC~~+RFID (SPONGE) ×1 IMPLANT
SUCTION TUBE FRAZIER 10FR DISP (SUCTIONS) IMPLANT
SUT ETHILON 2 0 FS 18 (SUTURE) IMPLANT
SUT MNCRL AB 3-0 PS2 18 (SUTURE) IMPLANT
SUT VIC AB 0 CT1 27XBRD ANBCTR (SUTURE) ×1 IMPLANT
SUT VIC AB 1 CT1 27XBRD ANBCTR (SUTURE) IMPLANT
SUT VIC AB 2-0 CT1 TAPERPNT 27 (SUTURE) IMPLANT
SUT VIC AB 3-0 SH 27X BRD (SUTURE) ×1 IMPLANT
SUTURE FIBERWR #2 38 T-5 BLUE (SUTURE) IMPLANT
SUTURE TAPE 1.3 FIBERLOP 20 ST (SUTURE) IMPLANT
SYR BULB EAR ULCER 3OZ GRN STR (SYRINGE) ×1 IMPLANT
SYR CONTROL 10ML LL (SYRINGE) IMPLANT
TOWEL GREEN STERILE FF (TOWEL DISPOSABLE) ×2 IMPLANT
TUBE CONNECTING 20X1/4 (TUBING) IMPLANT
UNDERPAD 30X36 HEAVY ABSORB (UNDERPADS AND DIAPERS) ×1 IMPLANT
YANKAUER SUCT BULB TIP NO VENT (SUCTIONS) IMPLANT

## 2024-08-03 NOTE — Discharge Instructions (Addendum)
 Holly Mountain, MD [facility]  Please read the following information regarding your care after surgery.  Medications   - Oxycodone  5 mg every 6 hours as needed for pain - Aspirin  81 mg twice daily as scheduled to prevent blood clots - Colace 100 mg twice daily as needed for constipation - Zofran  4 mg every 8 hours as needed for nausea/vomiting  We send above prescriptions to your pharmacy on file.  In addition you may also use: ? acetaminophen  (Tylenol ) 500 mg every 4-6 hours as you need for minor to moderate pain  Resume all other routine medications per usual or as directed by your PCP/other specialists.  Weight Bearing ? Do NOT bear any weight on the operated leg or foot. This means do NOT touch your surgical leg to the ground!  Cast / Splint / Dressing ? If you have a splint, do NOT remove this. Keep your splint, cast or dressing clean and dry.  Don't put anything (coat hanger, pencil, etc) down inside of it.  If it gets wet, call the office immediately to schedule an appointment for a cast change.  Swelling IMPORTANT: It is normal for you to have swelling where you had surgery. To reduce swelling and pain, keep at least 3 pillows under your leg so that your toes are above your nose and your heel is above the level of your hip.  It may be necessary to keep your foot or leg elevated for several weeks.  This is critical to helping your incisions heal and your pain to feel better.  Follow Up Call my office at [phone] when you are discharged from the hospital or surgery center to schedule an appointment to be seen 7-10 days after surgery.  Call my office at [phone] if you develop a fever >101.5 F, nausea, vomiting, bleeding from the surgical site or severe pain.    No Tylenol  before 8:30pm tonight.   Post Anesthesia Home Care Instructions  Activity: Get plenty of rest for the remainder of the day. A responsible individual must stay with you for 24 hours following  the procedure.  For the next 24 hours, DO NOT: -Drive a car -Advertising copywriter -Drink alcoholic beverages -Take any medication unless instructed by your physician -Make any legal decisions or sign important papers.  Meals: Start with liquid foods such as gelatin or soup. Progress to regular foods as tolerated. Avoid greasy, spicy, heavy foods. If nausea and/or vomiting occur, drink only clear liquids until the nausea and/or vomiting subsides. Call your physician if vomiting continues.  Special Instructions/Symptoms: Your throat may feel dry or sore from the anesthesia or the breathing tube placed in your throat during surgery. If this causes discomfort, gargle with warm salt water. The discomfort should disappear within 24 hours.  If you had a scopolamine  patch placed behind your ear for the management of post- operative nausea and/or vomiting:  1. The medication in the patch is effective for 72 hours, after which it should be removed.  Wrap patch in a tissue and discard in the trash. Wash hands thoroughly with soap and water. 2. You may remove the patch earlier than 72 hours if you experience unpleasant side effects which may include dry mouth, dizziness or visual disturbances. 3. Avoid touching the patch. Wash your hands with soap and water after contact with the patch.   Regional Anesthesia Blocks  1. You may not be able to move or feel the blocked extremity after a regional anesthetic block. This may last may  last from 3-48 hours after placement, but it will go away. The length of time depends on the medication injected and your individual response to the medication. As the nerves start to wake up, you may experience tingling as the movement and feeling returns to your extremity. If the numbness and inability to move your extremity has not gone away after 48 hours, please call your surgeon.   2. The extremity that is blocked will need to be protected until the numbness is gone and the  strength has returned. Because you cannot feel it, you will need to take extra care to avoid injury. Because it may be weak, you may have difficulty moving it or using it. You may not know what position it is in without looking at it while the block is in effect.  3. For blocks in the legs and feet, returning to weight bearing and walking needs to be done carefully. You will need to wait until the numbness is entirely gone and the strength has returned. You should be able to move your leg and foot normally before you try and bear weight or walk. You will need someone to be with you when you first try to ensure you do not fall and possibly risk injury.  4. Bruising and tenderness at the needle site are common side effects and will resolve in a few days.  5. Persistent numbness or new problems with movement should be communicated to the surgeon or the [facility] [phone] [facility] [phone]).

## 2024-08-03 NOTE — Anesthesia Procedure Notes (Signed)
 Procedure Name: Intubation Date/Time: 08/03/2024 11:27 AM  Performed by: Jolynn Mage, CRNAPre-anesthesia Checklist: Patient identified, Patient being monitored, Timeout performed, Emergency Drugs available and Suction available Patient Re-evaluated:Patient Re-evaluated prior to induction Oxygen Delivery Method: Circle System Utilized Preoxygenation: Pre-oxygenation with 100% oxygen Induction Type: IV induction Ventilation: Mask ventilation without difficulty Laryngoscope Size: Miller and 2 Grade View: Grade I Tube type: Oral Tube size: 7.0 mm Number of attempts: 1 Airway Equipment and Method: Stylet Placement Confirmation: ETT inserted through vocal cords under direct vision, positive ETCO2 and breath sounds checked- equal and bilateral Secured at: 21 cm Tube secured with: Tape Dental Injury: Teeth and Oropharynx as per pre-operative assessment

## 2024-08-03 NOTE — Anesthesia Preprocedure Evaluation (Signed)
 Anesthesia Evaluation  Patient identified by MRN, date of birth, ID band Patient awake    Reviewed: Allergy & Precautions, NPO status , Patient's Chart, lab work & pertinent test results  History of Anesthesia Complications (+) PONV and history of anesthetic complications  Airway Mallampati: I  TM Distance: >3 FB Neck ROM: Full    Dental no notable dental hx. (+) Teeth Intact   Pulmonary neg sleep apnea, neg COPD, Recent URI , Residual Cough, Patient abstained from smoking.Not current smoker   Patient had URI two weeks ago. Majority of the symptoms resolved one week ago. She still has a residual cough. Denies SOB, fevers.   Pulmonary exam normal breath sounds clear to auscultation       Cardiovascular Exercise Tolerance: Good METShypertension, Pt. on medications (-) CAD and (-) Past MI (-) dysrhythmias  Rhythm:Regular Rate:Normal - Systolic murmurs    Neuro/Psych negative neurological ROS  negative psych ROS   GI/Hepatic ,neg GERD  ,,(+)     (-) substance abuse    Endo/Other  neg diabetes    Renal/GU negative Renal ROS     Musculoskeletal  (+) Arthritis ,    Abdominal   Peds  Hematology   Anesthesia Other Findings Past Medical History: No date: Arthritis 04/2012: Heart murmur     Comment:  systolic murmur noted in CE 2013: History of Guillain-Barre syndrome     Comment:  02-10-2021  per pt no residual, resolved No date: History of kidney stones     Comment:  passed stones 2017: History of pregnancy induced hypertension     Comment:  postpartum preeclampsia, resolved after delivery 2017: Hypertension No date: PONV (postoperative nausea and vomiting) 04/2012: Pre-diabetes     Comment:  hx No date: Umbilical hernia  Reproductive/Obstetrics                              Anesthesia Physical Anesthesia Plan  ASA: 2  Anesthesia Plan: General   Post-op Pain Management: Regional  block*, Tylenol  PO (pre-op)* and Toradol  IV (intra-op)*   Induction: Intravenous  PONV Risk Score and Plan: 4 or greater and Ondansetron , Dexamethasone , Midazolam , TIVA and Propofol  infusion  Airway Management Planned: Oral ETT  Additional Equipment: None  Intra-op Plan:   Post-operative Plan: Extubation in OR  Informed Consent: I have reviewed the patients History and Physical, chart, labs and discussed the procedure including the risks, benefits and alternatives for the proposed anesthesia with the patient or authorized representative who has indicated his/her understanding and acceptance.     Dental advisory given  Plan Discussed with: CRNA and Surgeon  Anesthesia Plan Comments: (Discussed with patient that ideally we would wait 2-4 weeks after resolution of URI depending on severity of sickness, and anesthesia plan. She has already been delayed to allow for one week of recuperation. There is documentation that surgeon said this procedure is not purely elective given her ruptured achilles tendon. Patient has had majority of symptom resolution for a week with a mild residual likely post-viral cough. I did discuss she does have potential increased respiratory complication risk, and she understands and wishes to proceed. Discussed risks of anesthesia with patient, including PONV, sore throat, lip/dental/eye damage. Rare risks discussed as well, such as cardiorespiratory and neurological sequelae, and allergic reactions. Discussed the role of CRNA in patient's perioperative care. Patient understands. Discussed r/b/a of adductor canal and popliteal nerve block, including:  - bleeding, infection, nerve damage - poor or  non functioning block. - reactions and toxicity to local anesthetic Patient understands. )        Anesthesia Quick Evaluation

## 2024-08-03 NOTE — H&P (Signed)
 ORTHOPAEDIC SURGERY H&P  Subjective:  The patient presents for left Achilles rupture.   Past Medical History:  Diagnosis Date   Arthritis    Heart murmur 04/2012   systolic murmur noted in CE   History of Guillain-Barre syndrome 2013   02-10-2021  per pt no residual, resolved   History of kidney stones    passed stones   History of pregnancy induced hypertension 2017   postpartum preeclampsia, resolved after delivery   Hypertension 2017   PONV (postoperative nausea and vomiting)    Pre-diabetes 04/2012   hx   Umbilical hernia     Past Surgical History:  Procedure Laterality Date   CESAREAN SECTION N/A 12/15/2015   Procedure: CESAREAN SECTION;  Surgeon: Debby JINNY Dinsmore, MD;  Location: [facility] ORS;  Service: Obstetrics;  Laterality: N/A;   CESAREAN SECTION N/A 08/22/2019   Procedure: Repeat CESAREAN SECTION;  Surgeon: Gorge Ade, MD;  Location: [facility];  Service: Obstetrics;  Laterality: N/A;  EDD: 08/30/19   LAPAROSCOPIC TUBAL LIGATION Bilateral 02/13/2021   Procedure: BILATERAL LAPAROSCOPIC TUBAL LIGATION;  Surgeon: Gorge Ade, MD;  Location: [facility];  Service: Gynecology;  Laterality: Bilateral;   UMBILICAL HERNIA REPAIR N/A 02/13/2021   Procedure: LAPAROSCOPIC UMBILICAL HERNIA WITH MESH/LAPAROSCOPIC BILATERAL TUBAL LIGATION;  Surgeon: Rubin Calamity, MD;  Location: [facility];  Service: General;  Laterality: N/A;   WISDOM TOOTH EXTRACTION       Show/hide medication list[1]   Allergies[2]  Social History   Socioeconomic History   Marital status: Married    Spouse name: Not on file   Number of children: Not on file   Years of education: Not on file   Highest education level: Bachelor's degree (e.g., BA, AB, BS)  Occupational History    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  Tobacco Use   Smoking status: Never   Smokeless tobacco: Never  Vaping Use   Vaping status: Never Used  Substance and Sexual  Activity   Alcohol use: No   Drug use: Never   Sexual activity: Yes    Birth control/protection: Surgical    Comment: Lap Tubal Ligation  Other Topics Concern   Not on file  Social History Narrative   Not on file   Social Drivers of Health   Tobacco Use: Low Risk (08/03/2024)   Patient History    Smoking Tobacco Use: Never    Smokeless Tobacco Use: Never    Passive Exposure: Not on file  Financial Resource Strain: Low Risk (05/04/2024)   Overall Financial Resource Strain (CARDIA)    Difficulty of Paying Living Expenses: Not very hard  Food Insecurity: No Food Insecurity (05/04/2024)   Epic    Worried About Programme Researcher, Broadcasting/film/video in the Last Year: Never true    Ran Out of Food in the Last Year: Never true  Transportation Needs: No Transportation Needs (05/04/2024)   Epic    Lack of Transportation (Medical): No    Lack of Transportation (Non-Medical): No  Physical Activity: Insufficiently Active (05/04/2024)   Exercise Vital Sign    Days of Exercise per Week: 3 days    Minutes of Exercise per Session: 30 min  Stress: No Stress Concern Present (05/04/2024)   [employer] of Occupational Health - Occupational Stress Questionnaire    Feeling of Stress: Not at all  Social Connections: Socially Integrated (05/04/2024)   Social Connection and Isolation Panel    Frequency of Communication with Friends and Family: More than three times a  week    Frequency of Social Gatherings with Friends and Family: Once a week    Attends Religious Services: More than 4 times per year    Active Member of [employer] or Organizations: Yes    Attends Banker Meetings: More than 4 times per year    Marital Status: Married  Catering Manager Violence: Not on file  Depression (PHQ2-9): Low Risk (05/05/2024)   Depression (PHQ2-9)    PHQ-2 Score: 0  Alcohol Screen: Not on file  Housing: Unknown (05/04/2024)   Epic    Unable to Pay for Housing in the Last Year: No    Number of Times Moved in the  Last Year: Not on file    Homeless in the Last Year: No  Utilities: Not on file  Health Literacy: Not on file     History reviewed. No pertinent family history.   Review of Systems Pertinent items are noted in HPI.  Objective: Vital signs in last 24 hours:    08/03/2024    9:09 AM 08/02/2024    9:26 AM 07/25/2024    8:31 AM  Vitals with BMI  Height 5' 4 5' 4 5' 4  Weight 181 lbs 181 lbs 181 lbs  BMI 31.05 31.05 31.05  Systolic 137  128  Diastolic 94  97  Pulse 86  76      EXAM: General: Well nourished, well developed. Awake, alert and oriented to time, place, person. Normal mood and affect. No apparent distress. Breathing room air.  Operative Lower Extremity: Alignment - Neutral Deformity - None Skin intact Tenderness to palpation - None 0/5 TA, 5/5 PT, GS, Per, EHL, FHL Sensation intact to light touch throughout Palpable DP and PT pulses Special testing: None  The contralateral foot/ankle was examined for comparison and noted to be neurovascularly intact with no localized deformity, swelling, or tenderness.  Imaging Review All images taken were independently reviewed by me.  Assessment/Plan: The clinical and radiographic findings were reviewed and discussed at length with the patient.  The patient presents for left Achilles mid substance rupture.  We spoke at length about the natural course of these findings. We discussed nonoperative and operative treatment options in detail.  The risks and benefits were presented and reviewed. The risks due to tendon rerupture, suture failure and/or irritation, new/persistent infection, stiffness, nerve/vessel/tendon injury or rerupture of repaired tendon, nonunion/malunion, allograft usage, wound healing issues, development of arthritis, failure of this surgery, possibility of external fixation with delayed definitive surgery, need for further surgery, thromboembolic events, anesthesia/medical complications, amputation,  death among others were discussed.  Lillia Mountain  Orthopaedic Surgery [facility]     [1] (Not in an outpatient encounter) [2]  Allergies Allergen Reactions   Fluogen [Influenza Virus Vaccine]     guilliam barre

## 2024-08-03 NOTE — Anesthesia Procedure Notes (Signed)
 Anesthesia Regional Block: Popliteal block   Pre-Anesthetic Checklist: , timeout performed,  Correct Patient, Correct Site, Correct Laterality,  Correct Procedure, Correct Position, site marked,  Risks and benefits discussed,  Surgical consent,  Pre-op evaluation,  At surgeon's request and post-op pain management  Laterality: Lower and Left  Prep: chloraprep       Needles:  Injection technique: Single-shot  Needle Type: Echogenic Needle     Needle Length: 9cm  Needle Gauge: 21     Additional Needles:   Procedures:,,,, ultrasound used (permanent image in chart),,    Narrative:  Start time: 08/03/2024 10:25 AM End time: 08/03/2024 10:28 AM Injection made incrementally with aspirations every 5 mL.  Performed by: Personally  Anesthesiologist: Boone Fess, MD  Additional Notes: Patient's chart reviewed and they were deemed appropriate candidate for procedure, at surgeon's request. Patient educated about risks, benefits, and alternatives of the block including but not limited to: temporary or permanent nerve damage, bleeding, infection, damage to surround tissues, block failure, local anesthetic toxicity. Patient expressed understanding. A formal time-out was conducted consistent with institution rules.  Monitors were applied, and minimal sedation used. The site was prepped with skin prep and allowed to dry, and sterile gloves were used. A high frequency linear ultrasound probe with probe cover was utilized throughout. Popliteal artery pulsatile and visualized in popliteal fossa along with adjacent sciatic nerve and its branch point, which appeared anatomically normal, local anesthetic injected around them just proximal to the branch point, and echogenic block needle trajectory was monitored throughout. Aspiration performed every 5ml. Blood vessels were avoided. All injections were performed without resistance and free of blood and paresthesias. The patient tolerated the procedure  well.  Injectate: 10ml 0.5% bupivacaine  + 10ml exparel 

## 2024-08-03 NOTE — Anesthesia Postprocedure Evaluation (Signed)
 Anesthesia Post Note  Patient: [employer]  Procedure(s) Performed: REPAIR, TENDON, ACHILLES (Left) FASCIECTOMY, LOWER EXTREMITY (Left)     Patient location during evaluation: PACU Anesthesia Type: General Level of consciousness: awake and alert Pain management: pain level controlled Vital Signs Assessment: post-procedure vital signs reviewed and stable Respiratory status: spontaneous breathing, nonlabored ventilation, respiratory function stable and patient connected to nasal cannula oxygen Cardiovascular status: blood pressure returned to baseline and stable Postop Assessment: no apparent nausea or vomiting Anesthetic complications: no   No notable events documented.  Last Vitals:  Vitals:   08/03/24 1330 08/03/24 1345  BP: 120/80 120/80  Pulse: 69 68  Resp: 17 16  Temp:    SpO2: 98% 99%    Last Pain:  Vitals:   08/03/24 1330  TempSrc:   PainSc: 0-No pain                 Rome Ade

## 2024-08-03 NOTE — H&P (Signed)
 H&P Update:  -History and Physical Reviewed  -Patient has been re-examined  -No change in the plan of care  -The risks and benefits were presented and reviewed. The risks due to tendon rerupture, suture failure and/or irritation, new/persistent infection, stiffness, nerve/vessel/tendon injury or rerupture of repaired tendon, nonunion/malunion, allograft usage, wound healing issues, development of arthritis, failure of this surgery, possibility of external fixation with delayed definitive surgery, need for further surgery, thromboembolic events, anesthesia/medical complications, amputation, death among others were discussed.  The patient acknowledged the explanation, agreed to proceed with the plan and a consent was signed.  Lillia Mountain

## 2024-08-03 NOTE — Anesthesia Procedure Notes (Signed)
 Anesthesia Regional Block: Adductor canal block   Pre-Anesthetic Checklist: , timeout performed,  Correct Patient, Correct Site, Correct Laterality,  Correct Procedure, Correct Position, site marked,  Risks and benefits discussed,  Surgical consent,  Pre-op evaluation,  At surgeon's request and post-op pain management  Laterality: Lower and Left  Prep: chloraprep       Needles:  Injection technique: Single-shot  Needle Type: Echogenic Needle     Needle Length: 9cm  Needle Gauge: 21     Additional Needles:   Procedures:,,,, ultrasound used (permanent image in chart),,    Narrative:  Start time: 08/03/2024 10:28 AM End time: 08/03/2024 10:32 AM Injection made incrementally with aspirations every 5 mL.  Performed by: Personally  Anesthesiologist: Boone Fess, MD  Additional Notes: Patient's chart reviewed and they were deemed appropriate candidate for procedure, per surgeon's request. Patient educated about risks, benefits, and alternatives of the block including but not limited to: temporary or permanent nerve damage, bleeding, infection, damage to surround tissues, block failure, local anesthetic toxicity. Patient expressed understanding. A formal time-out was conducted consistent with institution rules.  Monitors were applied, and minimal sedation used (see nursing record). The site was prepped with skin prep and allowed to dry, and sterile gloves were used. A high frequency linear ultrasound probe with probe cover was utilized throughout. Femoral artery visualized at mid-thigh level, local anesthetic injected anterolateral to it, and echogenic block needle trajectory was monitored throughout. Hydrodissection of saphenous nerve visualized and appeared anatomically normal. Aspiration performed every 5ml. Blood vessels were avoided. All injections were performed without resistance and free of blood and paresthesias. The patient tolerated the procedure well.  Injectate: 20ml 0.25%  bupivacaine 

## 2024-08-03 NOTE — Transfer of Care (Signed)
 Immediate Anesthesia Transfer of Care Note  Patient: Holly Estrada  Procedure(s) Performed: REPAIR, TENDON, ACHILLES (Left) FASCIECTOMY, LOWER EXTREMITY (Left)  Patient Location: PACU  Anesthesia Type:GA combined with regional for post-op pain  Level of Consciousness: awake, alert , and patient cooperative  Airway & Oxygen Therapy: Patient Spontanous Breathing and Patient connected to face mask oxygen  Post-op Assessment: Report given to RN and Post -op Vital signs reviewed and stable  Post vital signs: Reviewed and stable  Last Vitals:  Vitals Value Taken Time  BP 127/79 08/03/24 12:39  Temp 36.6 C 08/03/24 12:39  Pulse 118 08/03/24 12:44  Resp 25 08/03/24 12:44  SpO2 98 % 08/03/24 12:44  Vitals shown include unfiled device data.  Last Pain:  Vitals:   08/03/24 0950  TempSrc:   PainSc: 0-No pain         Complications: No notable events documented.

## 2024-08-04 ENCOUNTER — Encounter (HOSPITAL_COMMUNITY): Payer: Self-pay | Admitting: Orthopaedic Surgery

## 2024-08-04 NOTE — Op Note (Signed)
 08/03/2024  4:17 PM   PATIENT: Holly Estrada  33 y.o. female  MRN: [id number]   PRE-OPERATIVE DIAGNOSIS:   [place] rupture of left achilles tendon   POST-OPERATIVE DIAGNOSIS:   Same   PROCEDURE: 1] Left Achilles tendon primary repair 2] Left posterior compartment fasciotomy   SURGEON:  Lillia Mountain, MD   ASSISTANT: None   ANESTHESIA: General, regional   EBL: Minimal   TOURNIQUET:    Total Tourniquet Time Documented: Thigh (Left) - 32 minutes Total: Thigh (Left) - 32 minutes    COMPLICATIONS: None apparent   DISPOSITION: Extubated, awake and stable to recovery.   INDICATION FOR PROCEDURE: The patient presented with above diagnosis.  We discussed the diagnosis, alternative treatment options, risks and benefits of the above surgical intervention, as well as alternative non-operative treatments. All questions/concerns were addressed and the patient/family demonstrated appropriate understanding of the diagnosis, the procedure, the postoperative course, and overall prognosis. The patient wished to proceed with surgical intervention and signed an informed surgical consent as such, in each others presence prior to surgery.   PROCEDURE IN DETAIL: After preoperative consent was obtained and the correct operative site was identified, the patient was brought to the operating room supine on stretcher. General anesthesia was induced. Preoperative antibiotics were administered. Surgical timeout was taken. The patient was then positioned prone. The operative lower extremity was prepped and draped in standard sterile fashion with a tourniquet around the thigh. The extremity was exsanguinated and the tourniquet was inflated to 275 mmHg.  A longitudinal incision was made over the palpable defect in the Achilles.  Dissection was carried sharply down through the subcutaneous tissues and peritenon creating full-thickness flaps medially and laterally.  The Achilles tendon  rupture was identified.  Hematoma was debrided.  The tendon ends were trimmed with scissors. The tendon ends were roughly approximated.  #1 Vicryl box sutures on CT-1 ineedle were placed in each tendon stump 1 cm, 2 cm and 3 cm from the rupture site.  The ankle was then maximally plantarflexed.  The tendon ends were then sequentially tied from near to far.  The [place] test was noted to be normal at this point.  A 3-0 Monocryl running circumferential stitch was placed around the tendon to augment the repair.  A posterior compartment fasciotomy was performed by incising the sheath of the flexor hallucis longus tendon.  The surgical sites were thoroughly irrigated. The tourniquet was deflated and hemostasis achieved. The deep layers were closed using 2-0 vicryl. The skin was closed without tension.    The leg was cleaned with saline and sterile dressings with gauze were applied. Well padded bulky dressings and wrap were applied. The patient was awakened from anesthesia and transported to the recovery room in stable condition.    FOLLOW UP PLAN: -transfer to PACU, then home -strict NWB operative extremity, maximum elevation -maintain short leg splint until follow up -DVT ppx: Aspirin 81 mg twice daily -follow up as outpatient within 7-10 days for wound check -sutures out in 2-3 weeks in outpatient office   RADIOGRAPHS: None    Lillia Mountain [facility]

## 2024-08-07 ENCOUNTER — Other Ambulatory Visit: Payer: Self-pay

## 2024-08-07 DIAGNOSIS — I10 Essential (primary) hypertension: Secondary | ICD-10-CM

## 2024-08-07 MED ORDER — AMLODIPINE BESYLATE 2.5 MG PO TABS: 2.5000 mg | ORAL_TABLET | Freq: Every day | ORAL | 1 refills | Status: AC

## 2024-12-06 ENCOUNTER — Ambulatory Visit: Admitting: Physician Assistant

## 2025-01-01 ENCOUNTER — Ambulatory Visit: Admitting: Dermatology

## 2025-01-18 ENCOUNTER — Encounter: Admitting: Nurse Practitioner

## 2025-01-22 ENCOUNTER — Encounter: Payer: Self-pay | Admitting: Nurse Practitioner
